# Patient Record
Sex: Female | Born: 2000 | Race: White | Hispanic: No | Marital: Single | Smoking: Former smoker
Health system: Southern US, Community
[De-identification: ages and names within clinical notes are randomized; demographics above are authoritative.]

## PROBLEM LIST (undated history)

## (undated) DIAGNOSIS — F419 Anxiety disorder, unspecified: Secondary | ICD-10-CM

## (undated) DIAGNOSIS — M549 Dorsalgia, unspecified: Secondary | ICD-10-CM

## (undated) DIAGNOSIS — F32A Depression, unspecified: Secondary | ICD-10-CM

## (undated) DIAGNOSIS — T7840XA Allergy, unspecified, initial encounter: Secondary | ICD-10-CM

## (undated) DIAGNOSIS — F329 Major depressive disorder, single episode, unspecified: Secondary | ICD-10-CM

## (undated) HISTORY — DX: Dorsalgia, unspecified: M54.9

## (undated) HISTORY — DX: Allergy, unspecified, initial encounter: T78.40XA

## (undated) HISTORY — DX: Major depressive disorder, single episode, unspecified: F32.9

## (undated) HISTORY — DX: Anxiety disorder, unspecified: F41.9

## (undated) HISTORY — DX: Depression, unspecified: F32.A

---
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
## 2019-01-04 ENCOUNTER — Encounter: Payer: Self-pay | Admitting: Physician Assistant

## 2019-01-05 ENCOUNTER — Ambulatory Visit (INDEPENDENT_AMBULATORY_CARE_PROVIDER_SITE_OTHER): Payer: Medicaid Other | Admitting: *Deleted

## 2019-01-05 ENCOUNTER — Other Ambulatory Visit: Payer: Self-pay

## 2019-01-05 DIAGNOSIS — Z3042 Encounter for surveillance of injectable contraceptive: Secondary | ICD-10-CM | POA: Diagnosis not present

## 2019-01-05 NOTE — Progress Notes (Signed)
Pt given Medroxyprogesterone inj Tolerated well 

## 2019-01-25 ENCOUNTER — Telehealth: Payer: Medicaid Other

## 2019-01-25 ENCOUNTER — Encounter: Payer: Self-pay | Admitting: Family Medicine

## 2019-01-25 ENCOUNTER — Ambulatory Visit (INDEPENDENT_AMBULATORY_CARE_PROVIDER_SITE_OTHER)
Admission: RE | Admit: 2019-01-25 | Discharge: 2019-01-25 | Disposition: A | Discharge status: Home / Self Care | Payer: Medicaid Other | Source: Ambulatory Visit

## 2019-01-25 DIAGNOSIS — B9789 Other viral agents as the cause of diseases classified elsewhere: Secondary | ICD-10-CM | POA: Diagnosis not present

## 2019-01-25 DIAGNOSIS — R05 Cough: Secondary | ICD-10-CM | POA: Diagnosis not present

## 2019-01-25 DIAGNOSIS — J069 Acute upper respiratory infection, unspecified: Secondary | ICD-10-CM | POA: Diagnosis not present

## 2019-01-25 DIAGNOSIS — J029 Acute pharyngitis, unspecified: Secondary | ICD-10-CM

## 2019-01-25 MED ORDER — ALBUTEROL SULFATE HFA 108 (90 BASE) MCG/ACT IN AERS: 1.0000 | INHALATION_SPRAY | Freq: Four times a day (QID) | RESPIRATORY_TRACT | 0 refills | Status: DC | PRN

## 2019-01-25 MED ORDER — CETIRIZINE HCL 10 MG PO TABS: 10.0000 mg | ORAL_TABLET | Freq: Every day | ORAL | 0 refills | Status: DC

## 2019-01-25 MED ORDER — BENZONATATE 100 MG PO CAPS: 100.0000 mg | ORAL_CAPSULE | Freq: Three times a day (TID) | ORAL | 0 refills | Status: DC

## 2019-01-25 NOTE — ED Provider Notes (Signed)
Virtual Visit via Video Note:  Ashlee Ramirez  initiated request for Telemedicine visit with [facility] team. I connected with Ashlee Ramirez  on 01/25/2019 at 1:38 PM  for a synchronized telemedicine visit using a video enabled HIPPA compliant telemedicine application. I verified that I am speaking with Ashlee Ramirez  using two identifiers. Georgetta Haber, NP  was physically located in a [facility] Urgent care site and ZAREAH KLEINSASSER was located at a different location.   The limitations of evaluation and management by telemedicine as well as the availability of in-person appointments were discussed. Patient was informed that she  may incur a bill ( including co-pay) for this virtual visit encounter. Ashlee Ramirez  expressed understanding and gave verbal consent to proceed with virtual visit.     History of Present Illness:Ashlee Ramirez  is a 18 y.o. female presents with complaints of 2-3 days of cough which is worsenign. Has a hard time stopping coughing. Denies shortness of breath. No fevers. No headache no body aches. Mild sore throat from cough. No ear pain. Patient's boyfriend's sister had a sinus infection- but her symptoms have been similar. No facial pressure. Taking OTC Cold and flu day and night formula. Has helped small amount. Denies gi/gu complaints. Smokes: 5 cigarettes a day. No asthma history. Has been off of school and work. School is to restart in two days and she is to go back to work in 1 week, which she is concerned about due to her symptoms.   Past Medical History:  Diagnosis Date  . Allergy   . Anxiety   . Back pain   . Depression     Allergies  Allergen Reactions  . Aspirin Other (See Comments)    nosebleed        Observations/Objective:  Alert, oriented. No increased work of breathing. No cough throughout exam. Speaking in complete sentences without difficulty. Skin appears dry. Lips normal tone.   Assessment and Plan: Viral URI  with cough. In presence of covid-19 pandemic do recommend 7 days of isolation. Discussed options for testing if wanted or required. Supportive cares. Return precautions provided. Patient verbalized understanding and agreeable to plan.    Follow Up Instructions:    I discussed the assessment and treatment plan with the patient. The patient was provided an opportunity to ask questions and all were answered. The patient agreed with the plan and demonstrated an understanding of the instructions.   The patient was advised to call back or seek an in-person evaluation if the symptoms worsen or if the condition fails to improve as anticipated.  I provided 15 minutes of non-face-to-face time during this encounter.    Georgetta Haber, NP  01/25/2019 1:38 PM         Georgetta Haber, NP 01/25/19 1339

## 2019-01-25 NOTE — Discharge Instructions (Addendum)
Nice to meet you! I have sent a few medications to help with your symptoms.  May continue with over the counter treatments as needed.  Use of inhaler as needed for wheezing or shortness of breath.   Tessalon 1-2 caps as needed for cough, every 8 hours.  Daily zyrtec.  Due to covid-19 pandemic we do recommend self isolation for 7 days from onset of cough symptoms, or until 3 days without fever, which ever is longer.  If you develop any worsening of symptoms, fevers, shortness of breath , chest pain  or persistent symptoms please come in to be seen or see your PCP.

## 2019-01-26 ENCOUNTER — Ambulatory Visit (INDEPENDENT_AMBULATORY_CARE_PROVIDER_SITE_OTHER): Payer: Medicaid Other | Admitting: Family Medicine

## 2019-01-26 ENCOUNTER — Other Ambulatory Visit: Payer: Self-pay

## 2019-01-26 NOTE — Progress Notes (Signed)
Error.  Patient did Televisit yesterday w/ Urgent care. No televisit needed.

## 2019-02-03 ENCOUNTER — Encounter: Payer: Self-pay | Admitting: Family

## 2019-02-03 ENCOUNTER — Telehealth (INDEPENDENT_AMBULATORY_CARE_PROVIDER_SITE_OTHER): Payer: Medicaid Other | Admitting: Family

## 2019-02-03 ENCOUNTER — Other Ambulatory Visit: Payer: Self-pay

## 2019-02-03 ENCOUNTER — Encounter: Payer: Self-pay | Admitting: Family Medicine

## 2019-02-03 DIAGNOSIS — J209 Acute bronchitis, unspecified: Secondary | ICD-10-CM | POA: Diagnosis not present

## 2019-02-03 MED ORDER — BENZONATATE 100 MG PO CAPS: 100.0000 mg | ORAL_CAPSULE | Freq: Three times a day (TID) | ORAL | 0 refills | Status: DC

## 2019-02-03 MED ORDER — FLUTICASONE PROPIONATE 50 MCG/ACT NA SUSP: 2.0000 | Freq: Every day | NASAL | 6 refills | Status: DC

## 2019-02-03 MED ORDER — PREDNISONE 10 MG (21) PO TBPK: ORAL_TABLET | ORAL | 0 refills | Status: DC

## 2019-02-03 NOTE — Progress Notes (Signed)
   Virtual Visit via telephone Note  I connected with Ashlee Ramirez on 02/03/19 at 3:23 pm by virtual visit  and verified that I am speaking with the correct person using two identifiers. Ashlee Ramirez is currently located at home  and no one is currently with her during visit. The provider, Jannifer Rodney, FNP is located in their office at time of visit.  I discussed the limitations, risks, security and privacy concerns of performing an evaluation and management service by telephone and the availability of in person appointments. I also discussed with the patient that there may be a patient responsible charge related to this service. The patient expressed understanding and agreed to proceed.   History and Present Illness:  PT connects to the office today for recurrent cough.  Cough  This is a new problem. The current episode started 1 to 4 weeks ago. The problem has been unchanged. The cough is non-productive. Pertinent negatives include no chills, ear congestion, ear pain, fever, myalgias, nasal congestion, rhinorrhea, sore throat, shortness of breath or wheezing. Associated symptoms comments: Sneezing . Risk factors for lung disease include smoking/tobacco exposure. She has tried rest and OTC cough suppressant (albuterol and tessalon) for the symptoms. The treatment provided mild relief.      Review of Systems  Constitutional: Negative for chills and fever.  HENT: Negative for ear pain, rhinorrhea and sore throat.   Respiratory: Positive for cough. Negative for shortness of breath and wheezing.   Musculoskeletal: Negative for myalgias.  All other systems reviewed and are negative.    Observations/Objective: No SOB or distress   Assessment and Plan: 1. Acute bronchitis, unspecified organism - Take meds as prescribed - Use a cool mist humidifier  -Use saline nose sprays frequently -Force fluids -For any cough or congestion  Use plain Mucinex- regular strength or max  strength is fine -For fever or aces or pains- take tylenol or ibuprofen. -Throat lozenges if help -RTO if symptoms worsen or do not improve  - predniSONE (STERAPRED UNI-PAK 21 TAB) 10 MG (21) TBPK tablet; Use as directed  Dispense: 21 tablet; Refill: 0 - benzonatate (TESSALON) 100 MG capsule; Take 1 capsule (100 mg total) by mouth every 8 (eight) hours.  Dispense: 21 capsule; Refill: 0 - fluticasone (FLONASE) 50 MCG/ACT nasal spray; Place 2 sprays into both nostrils daily.  Dispense: 16 g; Refill: 6     I discussed the assessment and treatment plan with the patient. The patient was provided an opportunity to ask questions and all were answered. The patient agreed with the plan and demonstrated an understanding of the instructions.   The patient was advised to call back or seek an in-person evaluation if the symptoms worsen or if the condition fails to improve as anticipated.  The above assessment and management plan was discussed with the patient. The patient verbalized understanding of and has agreed to the management plan. Patient is aware to call the clinic if symptoms persist or worsen. Patient is aware when to return to the clinic for a follow-up visit. Patient educated on when it is appropriate to go to the emergency department.   Time call ended: 3:38 pm   I provided 15 minutes of  face-to-face time during this encounter.    Jannifer Rodney, FNP

## 2019-02-04 ENCOUNTER — Other Ambulatory Visit: Payer: Self-pay

## 2019-02-04 ENCOUNTER — Encounter: Payer: Self-pay | Admitting: Family Medicine

## 2019-02-04 ENCOUNTER — Ambulatory Visit (INDEPENDENT_AMBULATORY_CARE_PROVIDER_SITE_OTHER): Payer: Medicaid Other | Admitting: Family Medicine

## 2019-02-04 DIAGNOSIS — B9689 Other specified bacterial agents as the cause of diseases classified elsewhere: Secondary | ICD-10-CM

## 2019-02-04 DIAGNOSIS — J208 Acute bronchitis due to other specified organisms: Secondary | ICD-10-CM

## 2019-02-04 MED ORDER — AMOXICILLIN-POT CLAVULANATE 875-125 MG PO TABS: 1.0000 | ORAL_TABLET | Freq: Two times a day (BID) | ORAL | 0 refills | Status: AC

## 2019-02-04 NOTE — Progress Notes (Signed)
Virtual Visit via telephone Note Due to COVID-19, visit is conducted virtually and was requested by patient. This visit type was conducted due to national recommendations for restrictions regarding the COVID-19 Pandemic (e.g. social distancing) in an effort to limit this patient's exposure and mitigate transmission in our community. All issues noted in this document were discussed and addressed.  A physical exam was not performed with this format.   I connected with Ashlee Ramirez on 02/04/19 at 1205 by telephone and verified that I am speaking with the correct person using two identifiers. Ashlee Ramirez is currently located at home and family is currently with them during visit. The provider, Kari Baars, FNP is located in their office at time of visit.  I discussed the limitations, risks, security and privacy concerns of performing an evaluation and management service by telephone and the availability of in person appointments. I also discussed with the patient that there may be a patient responsible charge related to this service. The patient expressed understanding and agreed to proceed.  Subjective:  Patient ID: Ashlee Ramirez, female    DOB: [birth year], 18 y.o.   MRN: [id number]  Chief Complaint:  Fever   HPI: Ashlee Ramirez is a 18 y.o. female presenting on 02/04/2019 for Fever   Pt reports ongoing cough, congestion, wheezing, and exertional shortness of breath. Pt states this started on Jan 24, 2019. She was seen in Urgent Care and diagnosed with URI. She was seen yesterday by video visit and diagnosed with acute bronchitis. Pt states she has been taking the prescribed tessalon, albuterol, and prednisone without relief of symptoms. She reports she developed a fever of 100.4 last night and continues to have a low grade fever. No chest pain, recent travel, or known COVID-19 exposures.     Relevant past medical, surgical, family, and social history reviewed and updated as  indicated.  Allergies and medications reviewed and updated.   Past Medical History:  Diagnosis Date  . Allergy   . Anxiety   . Back pain   . Depression     History reviewed. No pertinent surgical history.  Social History   Socioeconomic History  . Marital status: Single    Spouse name: Not on file  . Number of children: Not on file  . Years of education: Not on file  . Highest education level: Not on file  Occupational History  . Not on file  Social Needs  . Financial resource strain: Not on file  . Food insecurity:    Worry: Not on file    Inability: Not on file  . Transportation needs:    Medical: Not on file    Non-medical: Not on file  Tobacco Use  . Smoking status: Light Tobacco Smoker    Types: Cigarettes  . Smokeless tobacco: Never Used  Substance and Sexual Activity  . Alcohol use: No  . Drug use: No  . Sexual activity: Yes    Birth control/protection: Pill, Condom  Lifestyle  . Physical activity:    Days per week: Not on file    Minutes per session: Not on file  . Stress: Not on file  Relationships  . Social connections:    Talks on phone: Not on file    Gets together: Not on file    Attends religious service: Not on file    Active member of club or organization: Not on file    Attends meetings of clubs or organizations: Not on file  Relationship status: Not on file  . Intimate partner violence:    Fear of current or ex partner: Not on file    Emotionally abused: Not on file    Physically abused: Not on file    Forced sexual activity: Not on file  Other Topics Concern  . Not on file  Social History Narrative  . Not on file    Outpatient Encounter Medications as of 02/04/2019  Medication Sig  . albuterol (PROAIR HFA) 108 (90 Base) MCG/ACT inhaler Inhale 1-2 puffs into the lungs every 6 (six) hours as needed for wheezing or shortness of breath.  Marland Kitchen amoxicillin-clavulanate (AUGMENTIN) 875-125 MG tablet Take 1 tablet by mouth 2 (two) times daily  for 7 days.  . benzonatate (TESSALON) 100 MG capsule Take 1 capsule (100 mg total) by mouth every 8 (eight) hours.  . cetirizine (ZYRTEC) 10 MG tablet Take 1 tablet (10 mg total) by mouth daily.  . fluticasone (FLONASE) 50 MCG/ACT nasal spray Place 2 sprays into both nostrils daily.  . medroxyPROGESTERone (DEPO-PROVERA) 150 MG/ML injection Bring to office to have administered every 3 months  . predniSONE (STERAPRED UNI-PAK 21 TAB) 10 MG (21) TBPK tablet Use as directed   Facility-Administered Encounter Medications as of 02/04/2019  Medication  . medroxyPROGESTERone (DEPO-PROVERA) injection 150 mg    Allergies  Allergen Reactions  . Aspirin Other (See Comments)    nosebleed    Review of Systems  Constitutional: Positive for fatigue and fever. Negative for chills.  Respiratory: Positive for cough and shortness of breath (exertional). Negative for chest tightness and wheezing.   Cardiovascular: Negative for chest pain and palpitations.  Gastrointestinal: Negative for abdominal pain.  Genitourinary: Negative for decreased urine volume and difficulty urinating.  Musculoskeletal: Negative for arthralgias and myalgias.  Neurological: Negative for dizziness, syncope, weakness, light-headedness and headaches.  Psychiatric/Behavioral: Negative for confusion.  All other systems reviewed and are negative.        Observations/Objective: No vital signs or physical exam, this was a telephone or virtual health encounter.  Pt alert and oriented, answers all questions appropriately, and able to speak in full sentences.    Assessment and Plan: [name] was seen today for fever.  Diagnoses and all orders for this visit:  Acute bacterial bronchitis Due to ongoing symptoms and presence of fever, will initiate Augmentin. Continue other medications as prescribed. Report any new or worsening symptoms. Symptomatic care discussed.  -     amoxicillin-clavulanate (AUGMENTIN) 875-125 MG tablet; Take 1  tablet by mouth 2 (two) times daily for 7 days.     Follow Up Instructions: Return if symptoms worsen or fail to improve.    I discussed the assessment and treatment plan with the patient. The patient was provided an opportunity to ask questions and all were answered. The patient agreed with the plan and demonstrated an understanding of the instructions.   The patient was advised to call back or seek an in-person evaluation if the symptoms worsen or if the condition fails to improve as anticipated.  The above assessment and management plan was discussed with the patient. The patient verbalized understanding of and has agreed to the management plan. Patient is aware to call the clinic if symptoms persist or worsen. Patient is aware when to return to the clinic for a follow-up visit. Patient educated on when it is appropriate to go to the emergency department.    I provided 15 minutes of non-face-to-face time during this encounter. The call started at 1205. The  call ended at 1220. The other time was used for coordination of care.    Kari BaarsMichelle Frady Taddeo, FNP-C [facility] [address] [place], [place] [address] [phone]

## 2019-02-10 ENCOUNTER — Other Ambulatory Visit: Payer: Self-pay

## 2019-02-10 ENCOUNTER — Ambulatory Visit (HOSPITAL_COMMUNITY)
Admission: RE | Admit: 2019-02-10 | Discharge: 2019-02-10 | Disposition: A | Discharge status: Home / Self Care | Payer: BC Managed Care – PPO | Source: Ambulatory Visit | Attending: Emergency Medicine | Admitting: Emergency Medicine

## 2019-02-10 ENCOUNTER — Encounter (HOSPITAL_COMMUNITY): Payer: Self-pay

## 2019-02-10 ENCOUNTER — Emergency Department (HOSPITAL_COMMUNITY)
Admission: EM | Admit: 2019-02-10 | Discharge: 2019-02-10 | Disposition: A | Discharge status: Home / Self Care | Payer: BC Managed Care – PPO | Attending: Emergency Medicine | Admitting: Emergency Medicine

## 2019-02-10 DIAGNOSIS — R1011 Right upper quadrant pain: Secondary | ICD-10-CM | POA: Diagnosis not present

## 2019-02-10 DIAGNOSIS — R112 Nausea with vomiting, unspecified: Secondary | ICD-10-CM | POA: Insufficient documentation

## 2019-02-10 DIAGNOSIS — F17210 Nicotine dependence, cigarettes, uncomplicated: Secondary | ICD-10-CM | POA: Diagnosis not present

## 2019-02-10 DIAGNOSIS — Z79899 Other long term (current) drug therapy: Secondary | ICD-10-CM | POA: Insufficient documentation

## 2019-02-10 LAB — POC URINE PREG, ED: Preg Test, Ur: NEGATIVE

## 2019-02-10 LAB — CBC WITH DIFFERENTIAL/PLATELET
Abs Immature Granulocytes: 0.07 10*3/uL (ref 0.00–0.07)
Basophils Absolute: 0 10*3/uL (ref 0.0–0.1)
Basophils Relative: 0 %
Eosinophils Absolute: 0.4 10*3/uL (ref 0.0–0.5)
Eosinophils Relative: 3 %
HCT: 38.1 % (ref 36.0–46.0)
Hemoglobin: 12.9 g/dL (ref 12.0–15.0)
Immature Granulocytes: 1 %
Lymphocytes Relative: 28 %
Lymphs Abs: 3.3 10*3/uL (ref 0.7–4.0)
MCH: 30.4 pg (ref 26.0–34.0)
MCHC: 33.9 g/dL (ref 30.0–36.0)
MCV: 89.6 fL (ref 80.0–100.0)
Monocytes Absolute: 0.8 10*3/uL (ref 0.1–1.0)
Monocytes Relative: 7 %
Neutro Abs: 7.4 10*3/uL (ref 1.7–7.7)
Neutrophils Relative %: 61 %
Platelets: 302 10*3/uL (ref 150–400)
RBC: 4.25 MIL/uL (ref 3.87–5.11)
RDW: 12.4 % (ref 11.5–15.5)
WBC: 12 10*3/uL — ABNORMAL HIGH (ref 4.0–10.5)
nRBC: 0 % (ref 0.0–0.2)

## 2019-02-10 LAB — COMPREHENSIVE METABOLIC PANEL
ALT: 17 U/L (ref 0–44)
AST: 13 U/L — ABNORMAL LOW (ref 15–41)
Albumin: 4.2 g/dL (ref 3.5–5.0)
Alkaline Phosphatase: 68 U/L (ref 38–126)
Anion gap: 11 (ref 5–15)
BUN: 14 mg/dL (ref 6–20)
CO2: 24 mmol/L (ref 22–32)
Calcium: 8.9 mg/dL (ref 8.9–10.3)
Chloride: 102 mmol/L (ref 98–111)
Creatinine, Ser: 0.77 mg/dL (ref 0.44–1.00)
GFR calc Af Amer: >60 mL/min (ref >60)
GFR calc non Af Amer: >60 mL/min (ref >60)
Glucose, Bld: 94 mg/dL (ref 70–99)
Potassium: 3.4 mmol/L — ABNORMAL LOW (ref 3.5–5.1)
Sodium: 137 mmol/L (ref 135–145)
Total Bilirubin: 0.4 mg/dL (ref 0.3–1.2)
Total Protein: 7.5 g/dL (ref 6.5–8.1)

## 2019-02-10 LAB — URINALYSIS, ROUTINE W REFLEX MICROSCOPIC
Bacteria, UA: NONE SEEN
Bilirubin Urine: NEGATIVE
Glucose, UA: NEGATIVE mg/dL
Ketones, ur: NEGATIVE mg/dL
Leukocytes,Ua: NEGATIVE
Nitrite: NEGATIVE
Protein, ur: NEGATIVE mg/dL
Specific Gravity, Urine: 1.026 (ref 1.005–1.030)
pH: 5 (ref 5.0–8.0)

## 2019-02-10 LAB — LIPASE, BLOOD: Lipase: 36 U/L (ref 11–51)

## 2019-02-10 MED ORDER — ONDANSETRON 8 MG PO TBDP: 8.0000 mg | ORAL_TABLET | Freq: Three times a day (TID) | ORAL | 0 refills | Status: DC | PRN

## 2019-02-10 MED ORDER — HYDROCODONE-ACETAMINOPHEN 5-325 MG PO TABS: 1.0000 | ORAL_TABLET | Freq: Four times a day (QID) | ORAL | 0 refills | Status: DC | PRN

## 2019-02-10 MED ORDER — FENTANYL CITRATE (PF) 100 MCG/2ML IJ SOLN
50.0000 ug | Freq: Once | INTRAMUSCULAR | Status: AC
  Administered 2019-02-10: 50 ug via INTRAVENOUS
  Filled 2019-02-10: qty 2

## 2019-02-10 MED ORDER — ONDANSETRON HCL 4 MG/2ML IJ SOLN
4.0000 mg | Freq: Once | INTRAMUSCULAR | Status: AC
  Administered 2019-02-10: 4 mg via INTRAVENOUS
  Filled 2019-02-10: qty 2

## 2019-02-10 NOTE — ED Provider Notes (Signed)
[facility] EMERGENCY DEPARTMENT Provider Note   CSN: [id number] Arrival date & time: 02/10/19  0158    History   Chief Complaint Chief Complaint  Patient presents with  . Abdominal Pain    HPI Ashlee Ramirez is a 18 y.o. female.     The history is provided by the patient.  Abdominal Pain  Pain location:  RUQ Pain quality: sharp and stabbing   Pain radiates to:  Does not radiate Pain severity:  Moderate Onset quality:  Gradual Duration:  1 day Timing:  Intermittent Progression:  Worsening Chronicity:  New Context: not alcohol use   Relieved by:  Nothing Worsened by:  Palpation Ineffective treatments:  Acetaminophen Associated symptoms: vomiting   Associated symptoms: no chest pain, no diarrhea, no dysuria, no fever, no hematemesis, no hematochezia, no shortness of breath, no vaginal bleeding and no vaginal discharge   Risk factors: no alcohol abuse, no aspirin use, has not had multiple surgeries, no NSAID use and not pregnant   She reports onset of pain over 1 day ago  She reports associated nausea vomiting.  She has had nonbloody emesis.  She has never had this pain before Past Medical History:  Diagnosis Date  . Allergy   . Anxiety   . Back pain   . Depression     Patient Active Problem List   Diagnosis Date Noted  . BMI less than 19,adult 04/14/2018  . Depressive disorder 04/14/2018  . Excessive sleepiness 04/11/2018  . Anorexia 04/11/2018  . Enlarged tonsils 12/30/2017  . Seasonal allergies 12/30/2017  . Family history of early CAD 12/30/2017    History reviewed. No pertinent surgical history.   OB History    Gravida  0   Para  0   Term  0   Preterm  0   AB  0   Living  0     SAB  0   TAB  0   Ectopic  0   Multiple  0   Live Births  0            Home Medications    Prior to Admission medications   Medication Sig Start Date End Date Taking? Authorizing Provider  albuterol (PROAIR HFA) 108 (90 Base) MCG/ACT inhaler  Inhale 1-2 puffs into the lungs every 6 (six) hours as needed for wheezing or shortness of breath. 01/25/19   Georgetta HaberBurky, Natalie B, NP  amoxicillin-clavulanate (AUGMENTIN) 875-125 MG tablet Take 1 tablet by mouth 2 (two) times daily for 7 days. 02/04/19 02/11/19  Sonny Mastersakes, Linda M, FNP  benzonatate (TESSALON) 100 MG capsule Take 1 capsule (100 mg total) by mouth every 8 (eight) hours. 02/03/19   Junie SpencerHawks, Christy A, FNP  cetirizine (ZYRTEC) 10 MG tablet Take 1 tablet (10 mg total) by mouth daily. 01/25/19   Georgetta HaberBurky, Natalie B, NP  fluticasone (FLONASE) 50 MCG/ACT nasal spray Place 2 sprays into both nostrils daily. 02/03/19   Junie SpencerHawks, Christy A, FNP  medroxyPROGESTERone (DEPO-PROVERA) 150 MG/ML injection Bring to office to have administered every 3 months 09/08/18   Delynn FlavinGottschalk, Ashly M, DO  predniSONE (STERAPRED UNI-PAK 21 TAB) 10 MG (21) TBPK tablet Use as directed 02/03/19   Junie SpencerHawks, Christy A, FNP    Family History Family History  Problem Relation Age of Onset  . Pulmonary fibrosis Paternal Grandfather   . Heart attack Paternal Grandmother [id number]  . Hyperlipidemia Father   . Anxiety disorder Father   . Drug abuse Mother   . Atrial fibrillation Mother   .  Heart attack Maternal Grandfather 61    Social History Social History   Tobacco Use  . Smoking status: Light Tobacco Smoker    Types: Cigarettes  . Smokeless tobacco: Never Used  Substance Use Topics  . Alcohol use: No  . Drug use: No     Allergies   Aspirin   Review of Systems Review of Systems  Constitutional: Negative for fever.  Respiratory: Negative for shortness of breath.   Cardiovascular: Negative for chest pain.  Gastrointestinal: Positive for abdominal pain and vomiting. Negative for diarrhea, hematemesis and hematochezia.  Genitourinary: Negative for dysuria, vaginal bleeding and vaginal discharge.  All other systems reviewed and are negative.    Physical Exam Updated Vital Signs BP 120/70   Pulse 85   Temp 97.8 F (36.6 C) (Oral)    Resp 16   Ht 1.778 m (5\' 10" )   Wt 66.7 kg   SpO2 100%   BMI 21.09 kg/m   Physical Exam  CONSTITUTIONAL: Well developed/well nourished HEAD: Normocephalic/atraumatic EYES: EOMI/PERRL, no icterus ENMT: Mucous membranes moist NECK: supple no meningeal signs SPINE/BACK:entire spine nontender CV: S1/S2 noted, no murmurs/rubs/gallops noted LUNGS: Lungs are clear to auscultation bilaterally, no apparent distress ABDOMEN: soft, RUQ tenderness, no rebound or guarding, bowel sounds noted throughout abdomen GU: Right Cva tenderness NEURO: Pt is awake/alert/appropriate, moves all extremitiesx4.  No facial droop.   EXTREMITIES: pulses normal/equal, full ROM SKIN: warm, color normal PSYCH: no abnormalities of mood noted, alert and oriented to situation  ED Treatments / Results  Labs (all labs ordered are listed, but only abnormal results are displayed) Labs Reviewed  URINALYSIS, ROUTINE W REFLEX MICROSCOPIC - Abnormal; Notable for the following components:      Result Value   Hgb urine dipstick SMALL (*)    All other components within normal limits  CBC WITH DIFFERENTIAL/PLATELET - Abnormal; Notable for the following components:   WBC 12.0 (*)    All other components within normal limits  COMPREHENSIVE METABOLIC PANEL - Abnormal; Notable for the following components:   Potassium 3.4 (*)    AST 13 (*)    All other components within normal limits  LIPASE, BLOOD  POC URINE PREG, ED    EKG None  Radiology No results found.  Procedures Procedures  Medications Ordered in ED Medications  ondansetron (ZOFRAN) injection 4 mg (4 mg Intravenous Given 02/10/19 0228)  fentaNYL (SUBLIMAZE) injection 50 mcg (50 mcg Intravenous Given 02/10/19 0228)     Initial Impression / Assessment and Plan / ED Course  I have reviewed the triage vital signs and the nursing notes.  Pertinent labs  results that were available during my care of the patient were reviewed by me and considered in my  medical decision making (see chart for details).        Patient presents with right upper quadrant pain.  Overall she is well-appearing.  Labs essentially unremarkable except for mild elevation of white count She continues to have mild right upper quadrant tenderness.  Suspect biliary colic, but vitals appropriate, no vomiting, and LFTs reassuring. I feel she is appropriate for discharge home with follow-up as an outpatient.  Outpatient right upper quadrant ultrasound has been ordered.  Patient agreed with plan.  We discussed strict return precautions  Final Clinical Impressions(s) / ED Diagnoses   Final diagnoses:  Right upper quadrant abdominal pain    ED Discharge Orders    None       Ripley Fraise, MD 02/10/19 [phone]

## 2019-02-10 NOTE — ED Notes (Signed)
edp to room to assess  

## 2019-02-10 NOTE — ED Provider Notes (Signed)
Discussed negative [place] results. encouraged f/u with pcp. Return precautions given. Pt states she understands.    Franchot Heidelberg, PA-C 02/10/19 1337    Maudie Flakes, MD 02/11/19 [phone]

## 2019-02-10 NOTE — ED Triage Notes (Signed)
Monday night pt started having right sided pain. Not caused by anything.  A stabbing pain. Does hurt on palpation. 4/10 now. 6/10 on palpation. 5-6x of emesis. Described as brown.  Has never had any surgeries.

## 2019-02-11 ENCOUNTER — Other Ambulatory Visit: Payer: Self-pay

## 2019-02-11 DIAGNOSIS — Z20822 Contact with and (suspected) exposure to covid-19: Secondary | ICD-10-CM

## 2019-02-12 LAB — NOVEL CORONAVIRUS, NAA: SARS-CoV-2, NAA: NOT DETECTED

## 2019-02-15 ENCOUNTER — Telehealth: Payer: Self-pay | Admitting: Family Medicine

## 2019-02-15 NOTE — Telephone Encounter (Signed)
Results faxed to [facility]

## 2019-02-15 NOTE — Telephone Encounter (Signed)
Pt had COVID 19 test knows results are negative pt employer is requesting note showing results so she can return to work  [place]

## 2019-02-22 ENCOUNTER — Telehealth (INDEPENDENT_AMBULATORY_CARE_PROVIDER_SITE_OTHER): Payer: Medicaid Other | Admitting: Family Medicine

## 2019-02-22 ENCOUNTER — Encounter: Payer: Self-pay | Admitting: Family Medicine

## 2019-02-22 ENCOUNTER — Other Ambulatory Visit: Payer: Self-pay

## 2019-02-22 DIAGNOSIS — R1011 Right upper quadrant pain: Secondary | ICD-10-CM

## 2019-02-22 NOTE — Progress Notes (Signed)
Virtual Visit via video Note Due to COVID-19, visit is conducted virtually and was requested by patient. This visit type was conducted due to national recommendations for restrictions regarding the COVID-19 Pandemic (e.g. social distancing) in an effort to limit this patient's exposure and mitigate transmission in our community. All issues noted in this document were discussed and addressed.  A physical exam was not performed with this format.   I connected with Ashlee MorrowKayla L Nowakowski on 02/22/19 at 0830 by MyChart video and verified that I am speaking with the correct person using two identifiers. Ashlee Ramirez is currently located at home and no one is currently with them during visit. The provider, Kari BaarsMichelle Nohea Kras, FNP is located in their office at time of visit.  I discussed the limitations, risks, security and privacy concerns of performing an evaluation and management service by video and the availability of in person appointments. I also discussed with the patient that there may be a patient responsible charge related to this service. The patient expressed understanding and agreed to proceed.  Subjective:  Patient ID: Ashlee Ramirez, female    DOB: 04-Apr-2001, 18 y.o.   MRN: [id number]  Chief Complaint:  Abdominal Pain   HPI: Ashlee Ramirez is a 18 y.o. female presenting on 02/22/2019 for Abdominal Pain   Pt reports ongoing RUQ abdominal pain that radiates to her shoulder. States the pain is worse after eating a fatty or greasy meal. States she has nausea with the pain. She states she was seen in the ED on 02/10/2019 for same and was told everything was normal. Her WBC was slightly elevated, 12, at that time. No amylase or lipase reported. Liver enzymes were normal. A RUQ US was completed and was normal. She states the pain is intermittent and severe when is happens, 8/10, sharp and shooting. States she does have nausea with the pain. No rectal bleeding, hematochezia, melena, vomiting,  fever, NSAID use, alcohol use, pregnancy, or weakness.   Abdominal Pain This is a recurrent problem. The current episode started 1 to 4 weeks ago. The onset quality is sudden. The problem occurs intermittently. The problem has been waxing and waning. The pain is located in the RUQ. The pain is at a severity of 8/10. The pain is severe. The quality of the pain is sharp and cramping. The abdominal pain radiates to the right shoulder and chest. Associated symptoms include nausea. Pertinent negatives include no anorexia, arthralgias, belching, constipation, diarrhea, dysuria, fever, flatus, frequency, headaches, hematochezia, hematuria, melena, myalgias, vomiting or weight loss. The pain is aggravated by eating and palpation. The pain is relieved by nothing. She has tried acetaminophen for the symptoms. The treatment provided no relief. Prior diagnostic workup includes ultrasound. There is no history of abdominal surgery, colon cancer, Crohn's disease, gallstones, GERD, irritable bowel syndrome, pancreatitis, PUD or ulcerative colitis.     Relevant past medical, surgical, family, and social history reviewed and updated as indicated.  Allergies and medications reviewed and updated.   Past Medical History:  Diagnosis Date  . Allergy   . Anxiety   . Back pain   . Depression     History reviewed. No pertinent surgical history.  Social History   Socioeconomic History  . Marital status: Single    Spouse name: Not on file  . Number of children: Not on file  . Years of education: Not on file  . Highest education level: Not on file  Occupational History  . Not on file  Social  Needs  . Financial resource strain: Not on file  . Food insecurity    Worry: Not on file    Inability: Not on file  . Transportation needs    Medical: Not on file    Non-medical: Not on file  Tobacco Use  . Smoking status: Light Tobacco Smoker    Types: Cigarettes  . Smokeless tobacco: Never Used  Substance and  Sexual Activity  . Alcohol use: No  . Drug use: No  . Sexual activity: Yes    Birth control/protection: Pill, Condom  Lifestyle  . Physical activity    Days per week: Not on file    Minutes per session: Not on file  . Stress: Not on file  Relationships  . Social Musicianconnections    Talks on phone: Not on file    Gets together: Not on file    Attends religious service: Not on file    Active member of club or organization: Not on file    Attends meetings of clubs or organizations: Not on file    Relationship status: Not on file  . Intimate partner violence    Fear of current or ex partner: Not on file    Emotionally abused: Not on file    Physically abused: Not on file    Forced sexual activity: Not on file  Other Topics Concern  . Not on file  Social History Narrative  . Not on file    Outpatient Encounter Medications as of 02/22/2019  Medication Sig  . albuterol (PROAIR HFA) 108 (90 Base) MCG/ACT inhaler Inhale 1-2 puffs into the lungs every 6 (six) hours as needed for wheezing or shortness of breath.  . benzonatate (TESSALON) 100 MG capsule Take 1 capsule (100 mg total) by mouth every 8 (eight) hours.  . cetirizine (ZYRTEC) 10 MG tablet Take 1 tablet (10 mg total) by mouth daily.  . fluticasone (FLONASE) 50 MCG/ACT nasal spray Place 2 sprays into both nostrils daily.  Marland Kitchen. HYDROcodone-acetaminophen (NORCO/VICODIN) 5-325 MG tablet Take 1 tablet by mouth every 6 (six) hours as needed.  . medroxyPROGESTERone (DEPO-PROVERA) 150 MG/ML injection Bring to office to have administered every 3 months  . ondansetron (ZOFRAN ODT) 8 MG disintegrating tablet Take 1 tablet (8 mg total) by mouth every 8 (eight) hours as needed.  . predniSONE (STERAPRED UNI-PAK 21 TAB) 10 MG (21) TBPK tablet Use as directed   Facility-Administered Encounter Medications as of 02/22/2019  Medication  . medroxyPROGESTERone (DEPO-PROVERA) injection 150 mg    Allergies  Allergen Reactions  . Aspirin Other (See  Comments)    nosebleed    Review of Systems  Constitutional: Negative for chills, fatigue, fever and weight loss.  Respiratory: Negative for cough, chest tightness and shortness of breath.   Cardiovascular: Negative for chest pain, palpitations and leg swelling.  Gastrointestinal: Positive for abdominal pain and nausea. Negative for abdominal distention, anal bleeding, anorexia, blood in stool, constipation, diarrhea, flatus, hematochezia, melena, rectal pain and vomiting.  Genitourinary: Negative for decreased urine volume, difficulty urinating, dyspareunia, dysuria, frequency, hematuria, vaginal bleeding, vaginal discharge and vaginal pain.  Musculoskeletal: Negative for arthralgias and myalgias.  Neurological: Negative for dizziness, weakness and headaches.  Psychiatric/Behavioral: Negative for confusion.  All other systems reviewed and are negative.        Observations/Objective: No vital signs or physical exam, this was a telephone or virtual health encounter.  Pt alert and oriented, answers all questions appropriately, and able to speak in full sentences.    Assessment  and Plan: Van was seen today for abdominal pain.  Diagnoses and all orders for this visit:  RUQ pain Reported symptoms concerning for GB sludge or cholecystitis. [place] was negative. Pt still having symptoms after a fatty or greasy meal. Will order HIDA scan. Pt to follow up after imaging or sooner if symptoms worsen. Diet discussed.  -     NM Hepato W/Eject Fract; Future     Follow Up Instructions: Return in about 2 weeks (around 03/08/2019), or if symptoms worsen or fail to improve, for RUQ pain.    I discussed the assessment and treatment plan with the patient. The patient was provided an opportunity to ask questions and all were answered. The patient agreed with the plan and demonstrated an understanding of the instructions.   The patient was advised to call back or seek an in-person evaluation if the  symptoms worsen or if the condition fails to improve as anticipated.  The above assessment and management plan was discussed with the patient. The patient verbalized understanding of and has agreed to the management plan. Patient is aware to call the clinic if symptoms persist or worsen. Patient is aware when to return to the clinic for a follow-up visit. Patient educated on when it is appropriate to go to the emergency department.    I provided 15 minutes of video face-to-face time during this encounter. The call started at 0830. The call ended at [place]. The other time was used for coordination of care.    Monia Pouch, FNP-C [facility] [address] [place], [place] [address] [phone]

## 2019-03-09 ENCOUNTER — Encounter (HOSPITAL_COMMUNITY): Admission: RE | Admit: 2019-03-09 | Payer: BC Managed Care – PPO | Source: Ambulatory Visit

## 2019-03-25 ENCOUNTER — Encounter: Payer: Self-pay | Admitting: Family Medicine

## 2019-07-01 ENCOUNTER — Other Ambulatory Visit: Payer: Self-pay

## 2019-07-02 ENCOUNTER — Ambulatory Visit (INDEPENDENT_AMBULATORY_CARE_PROVIDER_SITE_OTHER): Payer: Medicaid Other | Admitting: Family Medicine

## 2019-07-02 ENCOUNTER — Encounter: Payer: Self-pay | Admitting: Family Medicine

## 2019-07-02 VITALS — BP 127/81 | HR 81 | Temp 97.8°F | Ht 70.0 in | Wt 152.0 lb

## 2019-07-02 DIAGNOSIS — R6889 Other general symptoms and signs: Secondary | ICD-10-CM

## 2019-07-02 DIAGNOSIS — L299 Pruritus, unspecified: Secondary | ICD-10-CM | POA: Diagnosis not present

## 2019-07-02 DIAGNOSIS — Z30011 Encounter for initial prescription of contraceptive pills: Secondary | ICD-10-CM | POA: Diagnosis not present

## 2019-07-02 DIAGNOSIS — F5101 Primary insomnia: Secondary | ICD-10-CM | POA: Diagnosis not present

## 2019-07-02 LAB — PREGNANCY, URINE: Preg Test, Ur: NEGATIVE

## 2019-07-02 MED ORDER — NORGESTIM-ETH ESTRAD TRIPHASIC 0.18/0.215/0.25 MG-35 MCG PO TABS: 1.0000 | ORAL_TABLET | Freq: Every day | ORAL | 2 refills | Status: DC

## 2019-07-02 MED ORDER — TRAZODONE HCL 50 MG PO TABS: 25.0000 mg | ORAL_TABLET | Freq: Every evening | ORAL | 2 refills | Status: DC | PRN

## 2019-07-02 NOTE — Patient Instructions (Signed)
You had labs performed today.  You will be contacted with the results of the labs once they are available, usually in the next 3 business days for routine lab work.  If you have an active my chart account, they will be released to your MyChart.  If you prefer to have these labs released to you via telephone, please let us know.  If you had a pap smear or biopsy performed, expect to be contacted in about 7-10 days.  Insomnia Insomnia is a sleep disorder that makes it difficult to fall asleep or stay asleep. Insomnia can cause fatigue, low energy, difficulty concentrating, mood swings, and poor performance at work or school. There are three different ways to classify insomnia:  Difficulty falling asleep.  Difficulty staying asleep.  Waking up too early in the morning. Any type of insomnia can be long-term (chronic) or short-term (acute). Both are common. Short-term insomnia usually lasts for three months or less. Chronic insomnia occurs at least three times a week for longer than three months. What are the causes? Insomnia may be caused by another condition, situation, or substance, such as:  Anxiety.  Certain medicines.  Gastroesophageal reflux disease (GERD) or other gastrointestinal conditions.  Asthma or other breathing conditions.  Restless legs syndrome, sleep apnea, or other sleep disorders.  Chronic pain.  Menopause.  Stroke.  Abuse of alcohol, tobacco, or illegal drugs.  Mental health conditions, such as depression.  Caffeine.  Neurological disorders, such as Alzheimer's disease.  An overactive thyroid (hyperthyroidism). Sometimes, the cause of insomnia may not be known. What increases the risk? Risk factors for insomnia include:  Gender. Women are affected more often than men.  Age. Insomnia is more common as you get older.  Stress.  Lack of exercise.  Irregular work schedule or working night shifts.  Traveling between different time zones.  Certain  medical and mental health conditions. What are the signs or symptoms? If you have insomnia, the main symptom is having trouble falling asleep or having trouble staying asleep. This may lead to other symptoms, such as:  Feeling fatigued or having low energy.  Feeling nervous about going to sleep.  Not feeling rested in the morning.  Having trouble concentrating.  Feeling irritable, anxious, or depressed. How is this diagnosed? This condition may be diagnosed based on:  Your symptoms and medical history. Your health care provider may ask about: ? Your sleep habits. ? Any medical conditions you have. ? Your mental health.  A physical exam. How is this treated? Treatment for insomnia depends on the cause. Treatment may focus on treating an underlying condition that is causing insomnia. Treatment may also include:  Medicines to help you sleep.  Counseling or therapy.  Lifestyle adjustments to help you sleep better. Follow these instructions at home: Eating and drinking   Limit or avoid alcohol, caffeinated beverages, and cigarettes, especially close to bedtime. These can disrupt your sleep.  Do not eat a large meal or eat spicy foods right before bedtime. This can lead to digestive discomfort that can make it hard for you to sleep. Sleep habits   Keep a sleep diary to help you and your health care provider figure out what could be causing your insomnia. Write down: ? When you sleep. ? When you wake up during the night. ? How well you sleep. ? How rested you feel the next day. ? Any side effects of medicines you are taking. ? What you eat and drink.  Make your bedroom a  dark, comfortable place where it is easy to fall asleep. ? Put up shades or blackout curtains to block light from outside. ? Use a white noise machine to block noise. ? Keep the temperature cool.  Limit screen use before bedtime. This includes: ? Watching TV. ? Using your smartphone, tablet, or  computer.  Stick to a routine that includes going to bed and waking up at the same times every day and night. This can help you fall asleep faster. Consider making a quiet activity, such as reading, part of your nighttime routine.  Try to avoid taking naps during the day so that you sleep better at night.  Get out of bed if you are still awake after 15 minutes of trying to sleep. Keep the lights down, but try reading or doing a quiet activity. When you feel sleepy, go back to bed. General instructions  Take over-the-counter and prescription medicines only as told by your health care provider.  Exercise regularly, as told by your health care provider. Avoid exercise starting several hours before bedtime.  Use relaxation techniques to manage stress. Ask your health care provider to suggest some techniques that may work well for you. These may include: ? Breathing exercises. ? Routines to release muscle tension. ? Visualizing peaceful scenes.  Make sure that you drive carefully. Avoid driving if you feel very sleepy.  Keep all follow-up visits as told by your health care provider. This is important. Contact a health care provider if:  You are tired throughout the day.  You have trouble in your daily routine due to sleepiness.  You continue to have sleep problems, or your sleep problems get worse. Get help right away if:  You have serious thoughts about hurting yourself or someone else. If you ever feel like you may hurt yourself or others, or have thoughts about taking your own life, get help right away. You can go to your nearest emergency department or call:  Your local emergency services (911 in the [place]  A suicide crisis helpline, such as the [facility] at 618-841-7030. This is open 24 hours a day. Summary  Insomnia is a sleep disorder that makes it difficult to fall asleep or stay asleep.  Insomnia can be long-term (chronic) or short-term  (acute).  Treatment for insomnia depends on the cause. Treatment may focus on treating an underlying condition that is causing insomnia.  Keep a sleep diary to help you and your health care provider figure out what could be causing your insomnia. This information is not intended to replace advice given to you by your health care provider. Make sure you discuss any questions you have with your health care provider. Document Released: 08/16/2000 Document Revised: 08/01/2017 Document Reviewed: 05/29/2017 Elsevier Patient Education  2020 [employer].

## 2019-07-02 NOTE — Progress Notes (Signed)
Subjective: CC: Contraception PCP: Janora Norlander, DO XTK:WIOXB Ashlee Ramirez is a 18 y.o. female presenting to clinic today for:  1. Contraceptive counseling Patient presents today for discussion of contraception.  She is a [id number] .  She reports her menstrual cycles are not regular since discontinuing the Depo-Provera.  Previous methods of birth control tried: OCPs, Depo.  She is sexually active with 1 female partner.  She denies h/o STIs.  She denies heavy menstrual bleeding, excessive cramping, abdominal masses.  She denies personal or family history of: liver disease, breast cancer, clotting disorder (including DVT/PE), migraine headaches.  No LMP recorded.  Last pap: N/A Last unprotected sex: Yesterday     2.  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  She feels cold even in 80 degree weather and often has to wear sweatpants.  Denies any unplanned weight loss.  3.  Pruritus Patient reports bilateral lower extremity pruritus without rash.  She notes that the symptoms are refractory to topical hydrocortisone cream, oral Benadryl.  Denies any change in soaps, lotions, detergents.  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  4.  Insomnia Patient reports difficulty with sleep.  She is able to fall asleep without difficulty but often does not stay asleep.  She wakes up pretty consistently and finds it difficult to fall back asleep.  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  She does report some increased stress with her job surrounding [place].  ROS: Per HPI  Allergies  Allergen Reactions  . Aspirin Other (See Comments)    nosebleed   Past Medical History:  Diagnosis Date  . Allergy   . Anxiety   . Back pain   . Depression    No current outpatient medications on file. Social  History   Socioeconomic History  . Marital status: Single    Spouse name: Not on file  . Number of children: Not on file  . Years of education: Not on file  . Highest education level: Not on file  Occupational History  . Not on file  Social Needs  . Financial resource strain: Not on file  . Food insecurity    Worry: Not on file    Inability: Not on file  . Transportation needs    Medical: Not on file    Non-medical: Not on file  Tobacco Use  . Smoking status: Current Every Day Smoker    Packs/day: 0.50    Types: Cigarettes  . Smokeless tobacco: Never Used  Substance and Sexual Activity  . Alcohol use: No  . Drug use: No  . Sexual activity: Yes    Birth control/protection: Pill, Condom  Lifestyle  . Physical activity    Days per week: Not on file    Minutes per session: Not on file  . Stress: Not on file  Relationships  . Social Herbalist on phone: Not on file    Gets together: Not on file    Attends religious service: Not on file    Active member of club or organization: Not on file    Attends meetings of clubs or organizations: Not on file    Relationship status: Not on file  . Intimate partner violence    Fear of current or  ex partner: Not on file    Emotionally abused: Not on file    Physically abused: Not on file    Forced sexual activity: Not on file  Other Topics Concern  . Not on file  Social History Narrative  . Not on file   Family History  Problem Relation Age of Onset  . Pulmonary fibrosis Paternal Grandfather   . Heart attack Paternal Grandmother 20  . Hyperlipidemia Father   . Anxiety disorder Father   . Drug abuse Mother   . Atrial fibrillation Mother   . Heart attack Maternal Grandfather 61    Objective: Office vital signs reviewed. BP 127/81   Pulse 81   Temp 97.8 F (36.6 C) (Temporal)   Ht 5' 10"  (1.778 m)   Wt 152 lb (68.9 kg)   BMI 21.81 kg/m   Physical Examination:  General: Awake, alert, well nourished, No  acute distress HEENT: Normal; no goiter, no exophthalmos Cardio: regular rate and rhythm, S1S2 heard, no murmurs appreciated Pulm: clear to auscultation bilaterally, no wheezes, rhonchi or rales; normal work of breathing on room air Extremities: warm, well perfused, No edema, cyanosis or clubbing; +2 pulses bilaterally MSK: normal gait and station Skin: dry; intact; no rashes or lesions appreciated on LEs Neuro: No tremor  Assessment/ Plan: 18 y.o. female   1. Encounter for initial prescription of contraceptive pills Wishes to resume OCPs.  Ortho Tri-Cyclen prescribed.  May start immediately.  Her urine pregnancy was negative.  I suspect that the absence of menses is related to dysregulation of ovulation after Depo-Provera. - Pregnancy, urine - Norgestimate-Ethinyl Estradiol Triphasic (ORTHO TRI-CYCLEN, 28,) 0.18/0.215/0.25 MG-35 MCG tablet; Take 1 tablet by mouth daily.  Dispense: 1 Package; Refill: 2  2. Pruritus Uncertain etiology.  We will look for eosinophilic changes and bilirubin levels to rule out other etiologies of pruritus without rash - CMP14+EGFR - CBC with Differential  3. Cold intolerance Check thyroid.  Check for anemia - TSH - CMP14+EGFR - CBC with Differential  4. Primary insomnia Trial of trazodone.  Home care directions were reviewed with the patient.  She will follow-up in 3 months for recheck. - traZODone (DESYREL) 50 MG tablet; Take 0.5-1 tablets (25-50 mg total) by mouth at bedtime as needed for sleep.  Dispense: 30 tablet; Refill: 2   Orders Placed This Encounter  Procedures  . Pregnancy, urine  . TSH  . CMP14+EGFR  . CBC with Differential   Meds ordered this encounter  Medications  . traZODone (DESYREL) 50 MG tablet    Sig: Take 0.5-1 tablets (25-50 mg total) by mouth at bedtime as needed for sleep.    Dispense:  30 tablet    Refill:  2  . Norgestimate-Ethinyl Estradiol Triphasic (ORTHO TRI-CYCLEN, 28,) 0.18/0.215/0.25 MG-35 MCG tablet    Sig:  Take 1 tablet by mouth daily.    Dispense:  1 Package    Refill:  [place] [place] [phone]

## 2019-07-03 LAB — CMP14+EGFR
ALT: 14 IU/L (ref 0–32)
AST: 16 IU/L (ref 0–40)
Albumin/Globulin Ratio: 1.7 (ref 1.2–2.2)
Albumin: 4.8 g/dL (ref 3.9–5.0)
Alkaline Phosphatase: 105 IU/L (ref 39–117)
BUN/Creatinine Ratio: 9 (ref 9–23)
BUN: 8 mg/dL (ref 6–20)
Bilirubin Total: 0.5 mg/dL (ref 0.0–1.2)
CO2: 24 mmol/L (ref 20–29)
Calcium: 10.2 mg/dL (ref 8.7–10.2)
Chloride: 105 mmol/L (ref 96–106)
Creatinine, Ser: 0.85 mg/dL (ref 0.57–1.00)
GFR calc Af Amer: 115 mL/min/{1.73_m2} (ref >59)
GFR calc non Af Amer: 100 mL/min/{1.73_m2} (ref >59)
Globulin, Total: 2.8 g/dL (ref 1.5–4.5)
Glucose: 88 mg/dL (ref 65–99)
Potassium: 4.9 mmol/L (ref 3.5–5.2)
Sodium: 140 mmol/L (ref 134–144)
Total Protein: 7.6 g/dL (ref 6.0–8.5)

## 2019-07-03 LAB — CBC WITH DIFFERENTIAL/PLATELET
Basophils Absolute: 0 10*3/uL (ref 0.0–0.2)
Basos: 0 %
EOS (ABSOLUTE): 0.2 10*3/uL (ref 0.0–0.4)
Eos: 3 %
Hematocrit: 40.6 % (ref 34.0–46.6)
Hemoglobin: 13.7 g/dL (ref 11.1–15.9)
Immature Grans (Abs): 0 10*3/uL (ref 0.0–0.1)
Immature Granulocytes: 0 %
Lymphocytes Absolute: 1.5 10*3/uL (ref 0.7–3.1)
Lymphs: 31 %
MCH: 30 pg (ref 26.6–33.0)
MCHC: 33.7 g/dL (ref 31.5–35.7)
MCV: 89 fL (ref 79–97)
Monocytes Absolute: 0.5 10*3/uL (ref 0.1–0.9)
Monocytes: 9 %
Neutrophils Absolute: 2.8 10*3/uL (ref 1.4–7.0)
Neutrophils: 57 %
Platelets: 313 10*3/uL (ref 150–450)
RBC: 4.57 x10E6/uL (ref 3.77–5.28)
RDW: 12.5 % (ref 11.7–15.4)
WBC: 4.9 10*3/uL (ref 3.4–10.8)

## 2019-07-03 LAB — TSH: TSH: 1.22 u[IU]/mL (ref 0.450–4.500)

## 2019-08-04 ENCOUNTER — Encounter: Payer: Self-pay | Admitting: Family Medicine

## 2019-09-01 ENCOUNTER — Encounter: Payer: Self-pay | Admitting: Family Medicine

## 2019-09-20 ENCOUNTER — Other Ambulatory Visit: Payer: Self-pay | Admitting: Family Medicine

## 2019-09-20 DIAGNOSIS — Z30011 Encounter for initial prescription of contraceptive pills: Secondary | ICD-10-CM

## 2019-10-05 ENCOUNTER — Ambulatory Visit: Payer: Medicaid Other | Admitting: Family Medicine

## 2019-10-06 ENCOUNTER — Encounter: Payer: Self-pay | Admitting: Family Medicine

## 2019-10-08 ENCOUNTER — Ambulatory Visit: Payer: Medicaid Other | Admitting: Family Medicine

## 2019-10-21 ENCOUNTER — Telehealth: Payer: Self-pay | Admitting: *Deleted

## 2019-10-21 NOTE — Telephone Encounter (Signed)
Pt was a noshow for an appt, called and scheduled her f/u 11/15/19 with Dr Reece Agar at 1:15.

## 2019-11-15 ENCOUNTER — Ambulatory Visit: Payer: Medicaid Other | Admitting: Family Medicine

## 2020-02-29 ENCOUNTER — Encounter: Payer: Medicaid Other | Admitting: Family Medicine

## 2020-02-29 ENCOUNTER — Encounter: Payer: Self-pay | Admitting: Family Medicine

## 2020-07-12 ENCOUNTER — Encounter: Payer: Managed Care, Other (non HMO) | Admitting: Family Medicine

## 2020-07-12 ENCOUNTER — Other Ambulatory Visit: Payer: Self-pay

## 2020-08-22 ENCOUNTER — Encounter: Payer: Self-pay | Admitting: *Deleted

## 2020-08-23 ENCOUNTER — Ambulatory Visit: Payer: Self-pay | Admitting: Adult Health

## 2020-08-30 ENCOUNTER — Ambulatory Visit (INDEPENDENT_AMBULATORY_CARE_PROVIDER_SITE_OTHER): Payer: Managed Care, Other (non HMO) | Admitting: Adult Health

## 2020-08-30 ENCOUNTER — Other Ambulatory Visit: Payer: Self-pay

## 2020-08-30 DIAGNOSIS — Z5329 Procedure and treatment not carried out because of patient's decision for other reasons: Secondary | ICD-10-CM

## 2020-08-30 NOTE — Progress Notes (Signed)
    New patient visit   Patient: Ashlee Ramirez   DOB: Mar 03, 2001   19 y.o. Female  MRN: [id number] Visit Date: 08/30/2020  Today's healthcare provider: Jairo Ben, FNP   Chief Complaint  Patient presents with  . New Patient (Initial Visit)   Subjective    SUI KASPAREK is a 19 y.o. female who presents today as a new patient to establish care she was not seen but sent to the emergencyt room for chest pain, tightness and variable heartrate reported due to office protocol. She was in no acute distress.  HPI  Patient reports that she feels fairly well today but would like to address fluctuatuing heart rate that has been ranging from 40-200 for the past 7 days, patient reports palpitations and chest tightness, chest  pain. Due to office protocol and patient with chest pain. Tightness for 7 days she is advised ED or urgent care for covid testing. Patient verbalized understanding of all instructions given and denies any further questions at this time.   No patient contact by provider.  Offered EMS for transport, she declined.  She will call to reschedule new patient appointment and seek emergency medical care now for chest pain and chest tightness with heart rate reported between 40-200 for last 7 days. She was advised to go via ambulance to the emergency room. Declined and left office.   [phone] (phone) [phone] (fax)  [facility]

## 2020-08-30 NOTE — Patient Instructions (Addendum)
Emergency room now or urgent care for evaluation advised now due to office policy. 911 was declined.

## 2020-08-31 DIAGNOSIS — Z5329 Procedure and treatment not carried out because of patient's decision for other reasons: Secondary | ICD-10-CM | POA: Insufficient documentation

## 2020-09-11 ENCOUNTER — Telehealth: Payer: Managed Care, Other (non HMO) | Admitting: Family

## 2020-09-11 DIAGNOSIS — R059 Cough, unspecified: Secondary | ICD-10-CM | POA: Diagnosis not present

## 2020-09-11 MED ORDER — ALBUTEROL SULFATE HFA 108 (90 BASE) MCG/ACT IN AERS: 2.0000 | INHALATION_SPRAY | Freq: Four times a day (QID) | RESPIRATORY_TRACT | 0 refills | Status: DC | PRN

## 2020-09-11 MED ORDER — BENZONATATE 100 MG PO CAPS: 100.0000 mg | ORAL_CAPSULE | Freq: Three times a day (TID) | ORAL | 0 refills | Status: DC | PRN

## 2020-09-11 MED ORDER — PREDNISONE 10 MG (21) PO TBPK: ORAL_TABLET | ORAL | 0 refills | Status: DC

## 2020-09-11 NOTE — Addendum Note (Signed)
Addended by: Jannifer Rodney A on: 09/11/2020 10:56 AM   Modules accepted: Orders

## 2020-09-11 NOTE — Progress Notes (Signed)

## 2020-09-19 ENCOUNTER — Other Ambulatory Visit: Payer: Self-pay

## 2020-09-19 ENCOUNTER — Encounter: Payer: Self-pay | Admitting: Internal Medicine

## 2020-09-19 ENCOUNTER — Ambulatory Visit (INDEPENDENT_AMBULATORY_CARE_PROVIDER_SITE_OTHER): Payer: Managed Care, Other (non HMO) | Admitting: Internal Medicine

## 2020-09-19 VITALS — BP 120/82 | HR 87 | Temp 98.1°F | Resp 16 | Ht 71.0 in | Wt 148.2 lb

## 2020-09-19 DIAGNOSIS — Z0289 Encounter for other administrative examinations: Secondary | ICD-10-CM

## 2020-09-19 DIAGNOSIS — Z111 Encounter for screening for respiratory tuberculosis: Secondary | ICD-10-CM

## 2020-09-19 DIAGNOSIS — R000 Tachycardia, unspecified: Secondary | ICD-10-CM

## 2020-09-19 DIAGNOSIS — Z7689 Persons encountering health services in other specified circumstances: Secondary | ICD-10-CM | POA: Diagnosis not present

## 2020-09-19 DIAGNOSIS — Z30013 Encounter for initial prescription of injectable contraceptive: Secondary | ICD-10-CM

## 2020-09-19 LAB — POCT URINALYSIS DIP (CLINITEK)
Bilirubin, UA: NEGATIVE
Blood, UA: NEGATIVE
Glucose, UA: NEGATIVE mg/dL
Ketones, POC UA: NEGATIVE mg/dL
Leukocytes, UA: NEGATIVE
Nitrite, UA: NEGATIVE
POC PROTEIN,UA: NEGATIVE
Spec Grav, UA: 1.015 (ref 1.010–1.025)
Urobilinogen, UA: 1 E.U./dL
pH, UA: 7.5 (ref 5.0–8.0)

## 2020-09-19 LAB — POCT URINE PREGNANCY: Preg Test, Ur: NEGATIVE

## 2020-09-19 MED ORDER — MEDROXYPROGESTERONE ACETATE 150 MG/ML IM SUSP
150.0000 mg | Freq: Once | INTRAMUSCULAR | Status: AC
  Administered 2020-09-19: 150 mg via INTRAMUSCULAR

## 2020-09-19 NOTE — Progress Notes (Signed)
Subjective:    RANEEN JAFFER - 20 y.o. female MRN [id number]  Date of birth: 2001/06/15  HPI  ADRIONNA DELCID is to establish care. Patient has a PMH significant for depression. She is currently an EMT. Starting clinicals for paramedic training. Needs paperwork filled out. Would also like to switch from OCP to Depo for contraception.   Also has concerns about her apple watch notifying her regarding tachycardia. She reports this occurs at rest. Does not seem to be related to anxiety. She has performed EKG on herself at times it has occurred while at work and read was sinus tachy. She denies associated symptoms such as vomiting, nausea, weakness, fevers/chills, diarrhea, LOC, headaches.     ROS per HPI     Health Maintenance:  Health Maintenance Due  Topic Date Due  . Hepatitis C Screening  Never done  . COVID-19 Vaccine (1) Never done  . CHLAMYDIA SCREENING  Never done  . HIV Screening  Never done     Past Medical History: Patient Active Problem List   Diagnosis Date Noted  . BMI less than 19,adult 04/14/2018  . Depressive disorder 04/14/2018  . Excessive sleepiness 04/11/2018  . Anorexia 04/11/2018  . Enlarged tonsils 12/30/2017  . Seasonal allergies 12/30/2017  . Family history of early CAD 12/30/2017      Social History   reports that she has been smoking cigarettes. She has been smoking about 0.50 packs per day. She has never used smokeless tobacco. She reports that she does not drink alcohol and does not use drugs.   Family History  family history includes Anxiety disorder in her father; Atrial fibrillation in her mother; Drug abuse in her mother; Heart attack (age of onset: 40) in her paternal grandmother; Heart attack (age of onset: 31) in her maternal grandfather; Hyperlipidemia in her father; Pulmonary fibrosis in her paternal grandfather.   Medications: reviewed and updated   Objective:   Physical Exam BP 120/82 (BP Location: Right Arm, Patient  Position: Sitting, Cuff Size: Normal)   Pulse 87   Temp 98.1 F (36.7 C) (Oral)   Resp 16   Ht 5\' 11"  (1.803 m)   Wt 148 lb 3.2 oz (67.2 kg)   SpO2 97%   BMI 20.67 kg/m  Physical Exam Constitutional:      Appearance: She is not diaphoretic.  HENT:     Head: Normocephalic and atraumatic.     Mouth/Throat:     Mouth: Oropharynx is clear and moist.      Comments: TMs normal bilaterally Eyes:     Extraocular Movements: EOM normal.     Conjunctiva/sclera: Conjunctivae normal.     Pupils: Pupils are equal, round, and reactive to light.  Neck:     Thyroid: No thyromegaly.  Cardiovascular:     Rate and Rhythm: Normal rate and regular rhythm.     Pulses: Intact distal pulses.     Heart sounds: Normal heart sounds. No murmur heard.   Pulmonary:     Effort: Pulmonary effort is normal. No respiratory distress.     Breath sounds: Normal breath sounds. No wheezing.  Abdominal:     General: Bowel sounds are normal. There is no distension.     Palpations: Abdomen is soft.     Tenderness: There is no abdominal tenderness. There is no guarding or rebound.  Musculoskeletal:        General: No deformity or edema. Normal range of motion.     Cervical back: Normal range of  motion and neck supple.  Lymphadenopathy:     Cervical: No cervical adenopathy.  Skin:    General: Skin is warm and dry.     Findings: No rash.  Neurological:     Mental Status: She is alert and oriented to person, place, and time.     Gait: Gait is intact.  Psychiatric:        Mood and Affect: Mood and affect normal.        Judgment: Judgment normal.         Assessment & Plan:   1. Encounter to establish care Reviewed patient's PMH, social history, surgical history, and medications.  - POCT URINALYSIS DIP (CLINITEK)  2. Encounter for completion of form with patient Paperwork for para-medicine clinicals completed.  - POCT URINALYSIS DIP (CLINITEK)  3. Tachycardia HR is normal at office visit. Cardiac  exam benign. Will obtain some lab work investigating for abnormalities that could cause elevations in HR. Asked patient to try to monitor for consistent scenarios if recurs. If recurs, will plan to obtain another EKG (reportedly normal at EMS station) and consider referral to cardiology.  - TSH - CBC - Basic metabolic panel  4. Screening for tuberculosis - QuantiFERON-TB Gold Plus  5. Encounter for initial prescription of injectable contraceptive - POCT urine pregnancy - medroxyPROGESTERone (DEPO-PROVERA) injection 150 mg     Marcy Siren, D.O. 09/26/2020, 3:56 PM Primary Care at [facility]

## 2020-09-19 NOTE — Progress Notes (Signed)
Estab. Care- has paperwork for school that needs completion   -Discuss getting COVID vaccines  -Discuss switching to depo shots instead of BC pills   Date last pap: within the last 3 yrs. Last Depo-Provera: years ago. Side Effects if any: none previously, 1st dose in years. Serum HCG indicated? yes- negative. Depo-Provera 150 mg IM given by: Barnett Abu, RN. Next appointment due . 4/5-4/19 Depo administered to R deltoid, pt tolerated well w/o adverse reaction noted

## 2020-09-21 LAB — QUANTIFERON-TB GOLD PLUS
QuantiFERON Mitogen Value: >10 IU/mL
QuantiFERON Nil Value: 0.01 IU/mL
QuantiFERON TB1 Ag Value: 0 IU/mL
QuantiFERON TB2 Ag Value: 0 IU/mL
QuantiFERON-TB Gold Plus: NEGATIVE

## 2020-09-21 LAB — CBC
Hematocrit: 47.3 % — ABNORMAL HIGH (ref 34.0–46.6)
Hemoglobin: 15.6 g/dL (ref 11.1–15.9)
MCH: 29.9 pg (ref 26.6–33.0)
MCHC: 33 g/dL (ref 31.5–35.7)
MCV: 91 fL (ref 79–97)
Platelets: 339 10*3/uL (ref 150–450)
RBC: 5.22 x10E6/uL (ref 3.77–5.28)
RDW: 12.7 % (ref 11.7–15.4)
WBC: 7.3 10*3/uL (ref 3.4–10.8)

## 2020-09-21 LAB — SPECIMEN STATUS REPORT

## 2020-09-21 LAB — BASIC METABOLIC PANEL
BUN/Creatinine Ratio: 8 — ABNORMAL LOW (ref 9–23)
BUN: 7 mg/dL (ref 6–20)
CO2: 23 mmol/L (ref 20–29)
Calcium: 10.3 mg/dL — ABNORMAL HIGH (ref 8.7–10.2)
Chloride: 98 mmol/L (ref 96–106)
Creatinine, Ser: 0.83 mg/dL (ref 0.57–1.00)
GFR calc Af Amer: 117 mL/min/{1.73_m2} (ref >59)
GFR calc non Af Amer: 102 mL/min/{1.73_m2} (ref >59)
Glucose: 73 mg/dL (ref 65–99)
Potassium: 4.1 mmol/L (ref 3.5–5.2)
Sodium: 138 mmol/L (ref 134–144)

## 2020-09-21 LAB — TSH: TSH: 0.659 u[IU]/mL (ref 0.450–4.500)

## 2020-09-29 ENCOUNTER — Encounter: Payer: Self-pay | Admitting: Internal Medicine

## 2020-09-29 ENCOUNTER — Telehealth (INDEPENDENT_AMBULATORY_CARE_PROVIDER_SITE_OTHER): Payer: Managed Care, Other (non HMO) | Admitting: Internal Medicine

## 2020-09-29 VITALS — Ht 71.0 in | Wt 140.0 lb

## 2020-09-29 DIAGNOSIS — F32A Depression, unspecified: Secondary | ICD-10-CM | POA: Diagnosis not present

## 2020-09-29 DIAGNOSIS — F419 Anxiety disorder, unspecified: Secondary | ICD-10-CM | POA: Diagnosis not present

## 2020-09-29 NOTE — Progress Notes (Signed)
Virtual Visit via Telephone Note  I connected with Ashlee Ramirez, on 09/29/2020 at 9:31 AM by telephone due to the COVID-19 pandemic and verified that I am speaking with the correct person using two identifiers.   Consent: I discussed the limitations, risks, security and privacy concerns of performing an evaluation and management service by telephone and the availability of in person appointments. I also discussed with the patient that there may be a patient responsible charge related to this service. The patient expressed understanding and agreed to proceed.   Location of Patient: Home  Location of Provider: Home    Persons participating in Telemedicine visit: Tessie Fass Dr. Earlene Plater      History of Present Illness: Patient wants a referral to psychiatrist. 2-3 year ago was established with psych. Was diagnosed with anxiety and depression. Was doing well so she stopped going thinking that she could manage it on her own. Figured out that she could not. She was previously Prozac, [place], [place]. Was established with a counselor and would like to be again.   Depression screen [facility] 2/9 09/29/2020 09/19/2020 07/02/2019  Decreased Interest 2 2 0  Down, Depressed, Hopeless 2 1 0  PHQ - 2 Score 4 3 0  Altered sleeping 2 3 0  Tired, decreased energy 2 3 0  Change in appetite - 2 0  Feeling bad or failure about yourself  2 0 0  Trouble concentrating 2 3 0  Moving slowly or fidgety/restless 0 2 0  Suicidal thoughts 0 0 0  PHQ-9 Score 12 16 0  Difficult doing work/chores Very difficult Somewhat difficult -  Some recent data might be hidden     GAD 7 : Generalized Anxiety Score 09/29/2020 09/19/2020 07/08/2018 05/15/2018  Nervous, Anxious, on Edge 1 2 3 2   Control/stop worrying 1 1 2 1   Worry too much - different things 1 2 3  0  Trouble relaxing 1 3 2 3   Restless 1 3 3 3   Easily annoyed or irritable 1 2 2 3   Afraid - awful might happen 1 0 0 1  Total GAD 7  Score 7 13 15 13   Anxiety Difficulty Somewhat difficult Somewhat difficult Somewhat difficult Very difficult    Past Medical History:  Diagnosis Date  . Allergy   . Anxiety   . Back pain   . Depression    Allergies  Allergen Reactions  . Aspirin Other (See Comments)    nosebleed    Current Outpatient Medications on File Prior to Visit  Medication Sig Dispense Refill  . albuterol (VENTOLIN HFA) 108 (90 Base) MCG/ACT inhaler Inhale 2 puffs into the lungs every 6 (six) hours as needed for wheezing or shortness of breath. (Patient not taking: Reported on 09/29/2020) 8 g 0  . benzonatate (TESSALON PERLES) 100 MG capsule Take 1 capsule (100 mg total) by mouth 3 (three) times daily as needed. (Patient not taking: No sig reported) 20 capsule 0  . predniSONE (STERAPRED UNI-PAK 21 TAB) 10 MG (21) TBPK tablet Use as directed (Patient not taking: No sig reported) 21 tablet 0  . traZODone (DESYREL) 50 MG tablet Take 0.5-1 tablets (25-50 mg total) by mouth at bedtime as needed for sleep. 30 tablet 2   No current facility-administered medications on file prior to visit.    Observations/Objective: NAD. Speaking clearly.  Work of breathing normal.  Alert and oriented. Mood appropriate.   Assessment and Plan: 1. Anxiety and depression PHQ-9 score concerning for moderate to severe  depression. GAD-7 score concerning for mild to moderate anxiety. Patient would like to establish with psych for medication management. In the meantime, have offered appointment with Jenel Lucks, LCSW and patient agreeable. No SI or safety concerns. Resources given.  - Ambulatory referral to Psychiatry   Follow Up Instructions: Visit with Jenel Lucks, LCSW    I discussed the assessment and treatment plan with the patient. The patient was provided an opportunity to ask questions and all were answered. The patient agreed with the plan and demonstrated an understanding of the instructions.   The patient was advised  to call back or seek an in-person evaluation if the symptoms worsen or if the condition fails to improve as anticipated.     I provided 7 minutes total of non-face-to-face time during this encounter including median intraservice time, reviewing previous notes, investigations, ordering medications, medical decision making, coordinating care and patient verbalized understanding at the end of the visit.    Marcy Siren, D.O. Primary Care at [facility]  09/29/2020, 9:31 AM

## 2020-09-29 NOTE — Progress Notes (Signed)
Discuss most recent lab results and referral for psychiatrist

## 2020-09-29 NOTE — Patient Instructions (Signed)
Behavioral Health Resources:   What if I or someone I know is in crisis?  . If you are thinking about harming yourself or having thoughts of suicide, or if you know someone who is, seek help right away.  . Call your doctor or mental health care provider.  . Call 911 or go to a hospital emergency room to get immediate help, or ask a friend or family member to help you do these things; IF YOU ARE IN [place], YOU MAY GO TO WALK-IN URGENT CARE 24/7 at [facility] (see below)  . Call the [place] National Suicide Prevention Lifeline's toll-free, 24-hour hotline at 1-800-273-TALK (913)751-9539) or TTY: 1-800-799-4 TTY (681) 649-6760) to talk to a trained counselor.  . If you are in crisis, make sure you are not left alone.   . If someone else is in crisis, make sure he or she is not left alone   24 Hour :   [facility]  [address] [place], [place] [address] [phone] or [place] 24/7  [facility] Mobile Crisis: [phone]  [place] National Suicide Hotline: 772-385-9177  [facility] [employer] (Domestic Violence, Rape & Victim Assistance)  (848)656-8179  [place]  [address], [place]  [address]   (313)209-3544 or (724) [phone]   [place]: [phone] (8am-4pm) or [phone] (after hours)        [facility], [address], [place], [place] Fax: [phone] [email or web address] *Interpreters available *Accepts all insurance and uninsured for Urgent Care needs *Accepts Medicaid and uninsured for outpatient treatment   [facility]   Mon-Fri: 8am-5pm [place], [place], [place]); [phone]) [email or web address].com  *Accepts Medicare  [facility] [address] Mason, Fri: 8am-4pm [place], [place], [place] [address] [phone] (phone); [phone] (fax) TaskTown.es  *[place] Mon-Fri: 9am-5pm  [address] [place], [place] (phone); [phone]  [email or web address]  *Accepts Medicaid  Jinny Blossom [facility] [address] Johnette Abraham [place], [place] SalonLookup.es   [facility] of the [place], 8:30am-12pm/1pm-2:30pm [address], [place], [place] (phone); [phone] (fax) www.[email or web address]  *Accepts Medicaid, sliding-scale*Bilingual services available  [facility] Mon-Fri, 8am-7pm [place], [place]  [phone](phone); [phone]) www.[email or web address]  *Accepts Medicaid *Bilingual services available  [facility] Mon-Fri: 8am-5pm, Saturday by appointment only [place], [place], [place] (phone); [phone] (fax) [email or web address]   [facility] [address], [place], [place], [place] [email or web address]  *Free & reduced services for uninsured and underinsured individuals *Bilingual services for Spanish-speaking clients 21 and under  [facility], [address] [place], [place]); [phone]) RunningConvention.de  *Bring your own interpreter at first visit *Accepts Medicare and [facility]  [place] Mon-Fri: 9am-5:30pm [address], [place], [place], [place] (phone), [phone] (fax) After hours crisis line: [phone] www.[email or web address]  *Accepts Medicare and Medicaid  [employer], 8am-6pm [address] [place], [place] (phone);  [phone] (fax) http://[email or web address]  *Subsidized costs available  [facility] Mon-Fri: 8am-4pm [address], [place], [place] [phone](phone); [phone]) www.psychotherapeuticservices.com  *Accepts Medicaid  [facility] Same day access hours: Mon-Fri, 8:30-3pm Crisis hours: Mon-Fri, 8am-5pm [address] [place], [place] [phone]  [facility] [employer] Same day access hours: Mon-Fri,  8:30-3pm Crisis hours: Mon-Fri, 8am-8pm [address], [place], [place] [phone] (phone); [phone] (fax) www.[email or web address]  *Accepts Medicaid and Medicare  [facility] Mon, Wed, Fri: 9am-9pm Tues, Thurs: 9am-6pm [address], [place], [place]  [phone] (phone); [phone] (fax) [email or web address]://[email or web address]  *(Accepts Medicare and Medicaid; payment plans available)*Bilingual services available  [facility] [address], [place], [place] [phone] (phone); [phone] (fax) www.[email or web address]   [facility] [address], [place], [place]  [phone]  [email or web address]  *Bilingual services available  [facility] Mon-Thurs: 8am-8pm [address], [place], [place] [phone] (phone); [phone] (fax) [email or web address]  *Accepts Medicaid*Bilingual services available  [facility] [address] [place], [place] [phone] (phone) [email or web address]://[email or web address]  *Accepts Medicaid *Bilingual services available  [facility] Mon-Fri, 9am-4:45pm [address], [place], [place] [phone] (phone); [phone] (fax) http://[email or web address]  *Accepts Medicare  [facility] Mon-Thurs: 8:30-8pm, Fri: 8:30am-7pm [address], [place], [place] (3rd floor) [phone] (phone); [phone]  (fax) [email or web address]  *Accepts Medicaid; income-based reduced rates available  [facility] Mon-Fri: 8am-5pm [address], [place],  [address] [phone] (phone); [phone] (fax) [email or web address]://[email or web address]  *Accepts Medicaid*Bilingual services available   MHAG ([facility]ental [facility] of [place])  [address],  [phone] www.mhag.org  *Provides direct services to individuals in recovery from mental illness, including support groups, recovery skills classes, and one on one peer support  NAMI (National Alliance on Mental Illness) [place] NAMI helpline: 336-370-4264  NAMI  helpline: 1-800-451-9682 https://namiguilford.org  *A community hub for information relating to local resources and services for the friends and families of individuals living alongside a mental health condition, as well as the individuals themselves. Classes and support groups also provided    

## 2020-10-26 ENCOUNTER — Other Ambulatory Visit: Payer: Self-pay

## 2020-10-26 ENCOUNTER — Ambulatory Visit: Payer: Managed Care, Other (non HMO) | Admitting: Licensed Clinical Social Worker

## 2020-10-26 DIAGNOSIS — F32A Depression, unspecified: Secondary | ICD-10-CM

## 2020-10-26 DIAGNOSIS — F419 Anxiety disorder, unspecified: Secondary | ICD-10-CM

## 2020-10-26 NOTE — BH Specialist Note (Signed)
MSW Intern spoke with Ashlee Ramirez at an in-person appointment regarding a referral to psychiatry and counseling services. Pt stated that they were experiencing anxiety and depression (low energy, fidgeting with her hands) symptoms since stopping therapy and medication management in early 2021. 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 MSW Intern and LCSW Jenel Lucks sent referral to Dr. Earlene Plater for an internal referral to [facility] through [facility] (for psychiatry and medication management), and to [facility] in [place] (for therapy services). Pt was satisfied with these referrals, and stated they are not having thoughts of harming themselves or others; in addition, pt stated they have good support systems in place as well with family, friends, and a potential relationship.

## 2020-11-27 ENCOUNTER — Telehealth: Payer: Managed Care, Other (non HMO) | Admitting: Physician Assistant

## 2020-11-27 DIAGNOSIS — J4541 Moderate persistent asthma with (acute) exacerbation: Secondary | ICD-10-CM

## 2020-11-27 MED ORDER — PREDNISONE 20 MG PO TABS: 40.0000 mg | ORAL_TABLET | Freq: Every day | ORAL | 0 refills | Status: DC

## 2020-11-27 MED ORDER — ALBUTEROL SULFATE (2.5 MG/3ML) 0.083% IN NEBU: 2.5000 mg | INHALATION_SOLUTION | Freq: Four times a day (QID) | RESPIRATORY_TRACT | 1 refills | Status: DC | PRN

## 2020-11-27 NOTE — Progress Notes (Signed)
I have spent 5 minutes in review of e-visit questionnaire, review and updating patient chart, medical decision making and response to patient.   Lucina Betty Cody Marquis Down, PA-C    

## 2020-11-27 NOTE — Progress Notes (Signed)
Visit for Asthma  Based on what you have shared with me, it looks like you may have a flare up of your asthma.  Asthma is a chronic (ongoing) lung disease which results in airway obstruction, inflammation and hyper-responsiveness.   Asthma symptoms vary from person to person, with common symptoms including nighttime awakening and decreased ability to participate in normal activities as a result of shortness of breath. It is often triggered by changes in weather, changes in the season, changes in air temperature, or inside (home, school, daycare or work) allergens such as animal dander, mold, mildew, woodstoves or cockroaches.   It can also be triggered by hormonal changes, extreme emotion, physical exertion or an upper respiratory tract illness.     It is important to identify the trigger, and then eliminate or avoid the trigger if possible.   If you have been prescribed medications to be taken on a regular basis, it is important to follow the asthma action plan and to follow guidelines to adjust medication in response to increasing symptoms of decreased peak expiratory flow rate  Treatment: I have prescribed: Albuterol (Proventil) (2.5 mg in 3 mL) 0.083 % Take by nebulization solution every six hours as needed for wheezing or shortness of breath and Prednisone 40mg  by mouth per day for 5 - 7 days.  You also need to go ahead and schedule a follow-up with your primary provider for reassessment and adjustment of daily medications for better asthma control. This will help prevent these exacerbations and breakthrough symptoms.   HOME CARE . Only take medications as instructed by your medical team. . Consider wearing a mask or scarf to improve breathing air temperature have been shown to decrease irritation and decrease exacerbations . Get rest. . Taking a steamy shower or using a  humidifier may help nasal congestion sand ease sore throat pain. You can place a towel over your head and breathe in the steam from hot water coming from a faucet. . Using a saline nasal spray works much the same way.  . Cough drops, hare candies and sore throat lozenges may ease your cough.  . Avoid close contacts especially the very you and the elderly . Cover your mouth if you cough or sneeze . Always remember to wash your hands.    GET HELP RIGHT AWAY IF: . You develop worsening symptoms; breathlessness at rest, drowsy, confused or agitated, unable to speak in full sentences . You have coughing fits . You develop a severe headache or visual changes . You develop shortness of breath, difficulty breathing or start having chest pain . Your symptoms persist after you have completed your treatment plan . If your symptoms do not improve within 10 days  MAKE SURE YOU . Understand these instructions. . Will watch your condition. . Will get help right away if you are not doing well or get worse.   Your e-visit answers were reviewed by a board certified advanced clinical practitioner to complete your personal care plan, Depending upon the condition, your plan could have included both over the counter or prescription medications.  Please review your pharmacy choice. Your safety is important to . If you have drug allergies check your prescription carefully. You can use MyChart to ask questions about today's visit, request a non-urgent call back, or ask for a work or school excuse for 24 hours related to this e-Visit. If it has been greater than 24 hours you will need to follow up with your provider, or enter  a new e-Visit to address those concerns.  You will get an e-mail in the next two days asking about your experience. I hope that your e-visit has been valuable and will speed your recovery. Thank you for using e-visits.

## 2020-12-06 ENCOUNTER — Ambulatory Visit: Payer: Managed Care, Other (non HMO)

## 2021-01-05 ENCOUNTER — Telehealth: Payer: Managed Care, Other (non HMO) | Admitting: Physician Assistant

## 2021-01-05 DIAGNOSIS — H66002 Acute suppurative otitis media without spontaneous rupture of ear drum, left ear: Secondary | ICD-10-CM

## 2021-01-05 MED ORDER — AMOXICILLIN 500 MG PO CAPS: 500.0000 mg | ORAL_CAPSULE | Freq: Three times a day (TID) | ORAL | 0 refills | Status: AC

## 2021-01-05 NOTE — Progress Notes (Signed)
E Visit for Ear Infection  We are sorry that you are not feeling well. Here is how we plan to help!  Based on what you have told me I believe you may have a bacterial infection of the inner ear. I have prescribed Amoxicillin 500mg  Take 1 tablet three times daily x 10 days, until completed.  If you have a fever 102 and up and significantly worsening symptoms, this could indicate a more serious infection moving to the middle/inner and needs face to face evaluation in an office by a provider.  Your symptoms should improve over the next 3 days and should resolve in about 7 days.  HOME CARE:   Wash your hands frequently.  Do not place the tip of the bottle on your ear or touch it with your fingers.  You can take Acetominophen 650 mg every 4-6 hours as needed for pain.  If pain is severe or moderate, you can apply a heating pad (set on low) or hot water bottle (wrapped in a towel) to outer ear for 20 minutes.  This will also increase drainage.  Avoid ear plugs  Do not use Q-tips  After showers, help the water run out by tilting your head to one side.  GET HELP RIGHT AWAY IF:   Fever is over 102.2 degrees.  You develop progressive ear pain or hearing loss.  Ear symptoms persist longer than 3 days after treatment.  MAKE SURE YOU:   Understand these instructions.  Will watch your condition.  Will get help right away if you are not doing well or get worse.  TO PREVENT SWIMMER'S EAR:  Use a bathing cap or custom fitted swim molds to keep your ears dry.  Towel off after swimming to dry your ears.  Tilt your head or pull your earlobes to allow the water to escape your ear canal.  If there is still water in your ears, consider using a hairdryer on the lowest setting.  Thank you for choosing an e-visit. Your e-visit answers were reviewed by a board certified advanced clinical practitioner to complete your personal care plan. Depending upon the condition, your plan could have  included both over the counter or prescription medications. Please review your pharmacy choice. Be sure that the pharmacy you have chosen is open so that you can pick up your prescription now.  If there is a problem you may message your provider in MyChart to have the prescription routed to another pharmacy. Your safety is important to . If you have drug allergies check your prescription carefully.  For the next 24 hours, you can use MyChart to ask questions about today's visit, request a non-urgent call back, or ask for a work or school excuse from your e-visit provider. You will get an email in the next two days asking about your experience. I hope that your e-visit has been valuable and will speed your recovery.  I provided 6 minutes of non face-to-face time during this encounter for chart review and documentation.

## 2021-02-13 ENCOUNTER — Emergency Department
Admission: EM | Admit: 2021-02-13 | Discharge: 2021-02-13 | Disposition: A | Discharge status: Home / Self Care | Payer: Managed Care, Other (non HMO) | Attending: Emergency Medicine | Admitting: Emergency Medicine

## 2021-02-13 ENCOUNTER — Other Ambulatory Visit: Payer: Self-pay

## 2021-02-13 ENCOUNTER — Emergency Department: Payer: Managed Care, Other (non HMO)

## 2021-02-13 DIAGNOSIS — R0789 Other chest pain: Secondary | ICD-10-CM | POA: Diagnosis not present

## 2021-02-13 DIAGNOSIS — R002 Palpitations: Secondary | ICD-10-CM | POA: Insufficient documentation

## 2021-02-13 DIAGNOSIS — Z87891 Personal history of nicotine dependence: Secondary | ICD-10-CM | POA: Insufficient documentation

## 2021-02-13 DIAGNOSIS — R000 Tachycardia, unspecified: Secondary | ICD-10-CM

## 2021-02-13 LAB — MAGNESIUM: Magnesium: 2.1 mg/dL (ref 1.7–2.4)

## 2021-02-13 LAB — TSH: TSH: 0.696 u[IU]/mL (ref 0.350–4.500)

## 2021-02-13 LAB — TROPONIN I (HIGH SENSITIVITY): Troponin I (High Sensitivity): <2 ng/L (ref <18)

## 2021-02-13 LAB — CBC
HCT: 38.1 % (ref 36.0–46.0)
Hemoglobin: 13 g/dL (ref 12.0–15.0)
MCH: 31.6 pg (ref 26.0–34.0)
MCHC: 34.1 g/dL (ref 30.0–36.0)
MCV: 92.5 fL (ref 80.0–100.0)
Platelets: 275 10*3/uL (ref 150–400)
RBC: 4.12 MIL/uL (ref 3.87–5.11)
RDW: 12.9 % (ref 11.5–15.5)
WBC: 7.2 10*3/uL (ref 4.0–10.5)
nRBC: 0 % (ref 0.0–0.2)

## 2021-02-13 LAB — BASIC METABOLIC PANEL
Anion gap: 6 (ref 5–15)
BUN: 8 mg/dL (ref 6–20)
CO2: 27 mmol/L (ref 22–32)
Calcium: 9.5 mg/dL (ref 8.9–10.3)
Chloride: 106 mmol/L (ref 98–111)
Creatinine, Ser: 0.76 mg/dL (ref 0.44–1.00)
GFR, Estimated: >60 mL/min (ref >60)
Glucose, Bld: 102 mg/dL — ABNORMAL HIGH (ref 70–99)
Potassium: 4.3 mmol/L (ref 3.5–5.1)
Sodium: 139 mmol/L (ref 135–145)

## 2021-02-13 NOTE — ED Triage Notes (Signed)
Pt come with c/o rapid HR. Pt states she was standing and her HR started racing. Pt states she became SOB, CP and palpitations. Pt states this has been going on for about week.  Pt states her HR got up to 180. Pt states it slows down once she is sitting.

## 2021-02-13 NOTE — ED Provider Notes (Signed)
[facility] Emergency Department Provider Note   ____________________________________________   Event Date/Time   First MD Initiated Contact with Patient 02/13/21 1827     (approximate)  I have reviewed the triage vital signs and the nursing notes.   HISTORY  Chief Complaint Tachycardia    HPI Ashlee Ramirez is a 20 y.o. female with past medical history of anxiety and anorexia who presents to the ED complaining of palpitations.  Patient reports that for the past 3 to 4 days she has been dealing with intermittent sensation of her heart racing along with discomfort in her chest.  She describes the discomfort as a dull ache that is only present when she feels like her heart is racing.  She denies any fevers, cough, or shortness of breath.  She states that her heart rate seems to be normal if she is seated and at rest, but as soon as she stands up to move around it can get as high as 180.  She has been tracking her heart rate on her apple watch and it varies between 120 and 180 when she is up moving around.  She states she has never had similar symptoms before, admits to having a [facility] today but overall denies significant caffeine consumption.  She does not take any medications on a regular basis, denies any drug use.        Past Medical History:  Diagnosis Date   Allergy    Anxiety    Back pain    Depression     Patient Active Problem List   Diagnosis Date Noted   BMI less than 19,adult 04/14/2018   Depressive disorder 04/14/2018   Excessive sleepiness 04/11/2018   Anorexia 04/11/2018   Enlarged tonsils 12/30/2017   Seasonal allergies 12/30/2017   Family history of early CAD 12/30/2017    History reviewed. No pertinent surgical history.  Prior to Admission medications   Medication Sig Start Date End Date Taking? Authorizing Provider  albuterol (PROVENTIL) (2.5 MG/3ML) 0.083% nebulizer solution Take 3 mLs (2.5 mg total) by nebulization  every 6 (six) hours as needed for wheezing or shortness of breath. 11/27/20   Waldon Merl, PA-C  albuterol (VENTOLIN HFA) 108 (90 Base) MCG/ACT inhaler Inhale 2 puffs into the lungs every 6 (six) hours as needed for wheezing or shortness of breath. Patient not taking: Reported on 09/29/2020 09/11/20   Junie Spencer, FNP    Allergies Aspirin  Family History  Problem Relation Age of Onset   Pulmonary fibrosis Paternal Grandfather    Heart attack Paternal Grandmother 25   Hyperlipidemia Father    Anxiety disorder Father    Drug abuse Mother    Atrial fibrillation Mother    Heart attack Maternal Grandfather 25    Social History Social History   Tobacco Use   Smoking status: Former    Packs/day: 0.50    Pack years: 0.00    Types: Cigarettes   Smokeless tobacco: Never  Vaping Use   Vaping Use: Some days   Substances: Nicotine  Substance Use Topics   Alcohol use: No   Drug use: No    Review of Systems  Constitutional: No fever/chills Eyes: No visual changes. ENT: No sore throat. Cardiovascular: Positive for palpitations and chest pain. Respiratory: Denies shortness of breath. Gastrointestinal: No abdominal pain.  No nausea, no vomiting.  No diarrhea.  No constipation. Genitourinary: Negative for dysuria. Musculoskeletal: Negative for back pain. Skin: Negative for rash. Neurological: Negative for  headaches, focal weakness or numbness.  ____________________________________________   PHYSICAL EXAM:  VITAL SIGNS: ED Triage Vitals  Enc Vitals Group     BP 02/13/21 1638 (!) 138/94     Pulse Rate 02/13/21 1638 91     Resp 02/13/21 1638 18     Temp 02/13/21 1638 98.2 F (36.8 C)     Temp Source 02/13/21 1638 Oral     SpO2 02/13/21 1638 100 %     Weight --      Height --      Head Circumference --      Peak Flow --      Pain Score 02/13/21 1638 0     Pain Loc --      Pain Edu? --      Excl. in GC? --     Constitutional: Alert and oriented. Eyes:  Conjunctivae are normal. Head: Atraumatic. Nose: No congestion/rhinnorhea. Mouth/Throat: Mucous membranes are moist. Neck: Normal ROM Cardiovascular: Normal rate, regular rhythm. Grossly normal heart sounds.  2+ radial pulses bilaterally. Respiratory: Normal respiratory effort.  No retractions. Lungs CTAB. Gastrointestinal: Soft and nontender. No distention. Genitourinary: deferred Musculoskeletal: No lower extremity tenderness nor edema. Neurologic:  Normal speech and language. No gross focal neurologic deficits are appreciated. Skin:  Skin is warm, dry and intact. No rash noted. Psychiatric: Mood and affect are normal. Speech and behavior are normal.  ____________________________________________   LABS (all labs ordered are listed, but only abnormal results are displayed)  Labs Reviewed  BASIC METABOLIC PANEL - Abnormal; Notable for the following components:      Result Value   Glucose, Bld 102 (*)    All other components within normal limits  CBC  TSH  MAGNESIUM  POC URINE PREG, ED  TROPONIN I (HIGH SENSITIVITY)  TROPONIN I (HIGH SENSITIVITY)   ____________________________________________  EKG  ED ECG REPORT I, Chesley Noon, the attending physician, personally viewed and interpreted this ECG.   Date: 02/13/2021  EKG Time: 16:43  Rate: 98  Rhythm: normal sinus rhythm  Axis: RAD  Intervals:none  ST&T Change: None   PROCEDURES  Procedure(s) performed (including Critical Care):  Procedures   ____________________________________________   INITIAL IMPRESSION / ASSESSMENT AND PLAN / ED COURSE      20 year old female with past medical history of anxiety and anorexia who presents to the ED complaining of intermittent palpitations and chest discomfort while up and moving for the past 3 to 4 days.  EKG shows normal sinus rhythm with right axis deviation but no ischemic changes.  Initial troponin is negative and I have low suspicion for ACS.  CBC and BMP are  unremarkable.  We will observe patient on cardiac monitor, add on magnesium and TSH.  Magnesium and TSH are within normal limits, patient's heart rate appears to have normalized and is consistently around 80 in normal sinus rhythm.  She stood up and took a few steps while on cardiac monitor, heart rate increased to no greater than 105.  Patient is appropriate for discharge home with cardiology follow-up at this time, she was counseled to return to the ED for new or worsening symptoms.  Patient agrees with plan.      ____________________________________________   FINAL CLINICAL IMPRESSION(S) / ED DIAGNOSES  Final diagnoses:  Palpitations  Tachycardia     ED Discharge Orders     None        Note:  This document was prepared using Dragon voice recognition software and may include unintentional dictation errors.  Chesley Noon, MD 02/13/21 2026

## 2021-03-15 ENCOUNTER — Ambulatory Visit (INDEPENDENT_AMBULATORY_CARE_PROVIDER_SITE_OTHER): Payer: Managed Care, Other (non HMO)

## 2021-03-15 ENCOUNTER — Ambulatory Visit (INDEPENDENT_AMBULATORY_CARE_PROVIDER_SITE_OTHER): Payer: Managed Care, Other (non HMO) | Admitting: Cardiology

## 2021-03-15 ENCOUNTER — Other Ambulatory Visit: Payer: Self-pay

## 2021-03-15 ENCOUNTER — Encounter: Payer: Self-pay | Admitting: Cardiology

## 2021-03-15 VITALS — BP 106/66 | HR 68 | Ht 71.0 in | Wt 139.0 lb

## 2021-03-15 DIAGNOSIS — R002 Palpitations: Secondary | ICD-10-CM

## 2021-03-15 DIAGNOSIS — R000 Tachycardia, unspecified: Secondary | ICD-10-CM | POA: Diagnosis not present

## 2021-03-15 DIAGNOSIS — Z7289 Other problems related to lifestyle: Secondary | ICD-10-CM

## 2021-03-15 NOTE — Patient Instructions (Signed)
Medication Instructions:  Your physician recommends that you continue on your current medications as directed. Please refer to the Current Medication list given to you today.  *If you need a refill on your cardiac medications before your next appointment, please call your pharmacy*   Lab Work: None ordered If you have labs (blood work) drawn today and your tests are completely normal, you will receive your results only by: MyChart Message (if you have MyChart) OR A paper copy in the mail If you have any lab test that is abnormal or we need to change your treatment, we will call you to review the results.   Testing/Procedures:  Your physician has recommended that you wear a Zio monitor for 2 weeks.   This monitor is a medical device that records the heart's electrical activity. Doctors most often use these monitors to diagnose arrhythmias. Arrhythmias are problems with the speed or rhythm of the heartbeat. The monitor is a small device applied to your chest. You can wear one while you do your normal daily activities. While wearing this monitor if you have any symptoms to push the button and record what you felt. Once you have worn this monitor for the period of time provider prescribed (Usually 14 days), you will return the monitor device in the postage paid box. Once it is returned they will download the data collected and provide [place] with a report which the provider will then review and we will call you with those results. Important tips:  Avoid showering during the first 24 hours of wearing the monitor. Avoid excessive sweating to help maximize wear time. Do not submerge the device, no hot tubs, and no swimming pools. Keep any lotions or oils away from the patch. After 24 hours you may shower with the patch on. Take brief showers with your back facing the shower head.  Do not remove patch once it has been placed because that will interrupt data and decrease adhesive wear time. Push the  button when you have any symptoms and write down what you were feeling. Once you have completed wearing your monitor, remove and place into box which has postage paid and place in your outgoing mailbox.  If for some reason you have misplaced your box then call our office and we can provide another box and/or mail it off for you.    Follow-Up: At [facility], you and your health needs are our priority.  As part of our continuing mission to provide you with exceptional heart care, we have created designated Provider Care Teams.  These Care Teams include your primary Cardiologist (physician) and Advanced Practice Providers (APPs -  Physician Assistants and Nurse Practitioners) who all work together to provide you with the care you need, when you need it.  We recommend signing up for the patient portal called "MyChart".  Sign up information is provided on this After Visit Summary.  MyChart is used to connect with patients for Virtual Visits (Telemedicine).  Patients are able to view lab/test results, encounter notes, upcoming appointments, etc.  Non-urgent messages can be sent to your provider as well.   To learn more about what you can do with MyChart, go to [email or web address].    Your next appointment:   6 week(s)  The format for your next appointment:   In Person  Provider:   Debbe Odea, MD  ONLY   Other Instructions

## 2021-03-15 NOTE — Progress Notes (Signed)
Cardiology Office Note:    Date:  03/15/2021   ID:  Ashlee Ramirez, DOB 2001-08-06, MRN [id number]  PCP:  Aviva Kluver   [facility] Providers Cardiologist:  Debbe Odea, MD     Referring MD: Chesley Noon, MD   Chief Complaint  Patient presents with   New Patient (Initial Visit)    Referred by ED for Palpitations and tachycardia. Meds reviewed verbally with patient.    Ashlee Ramirez is a 20 y.o. female who is being seen today for the evaluation of palpitations at the request of Chesley Noon, MD.   History of Present Illness:    Ashlee Ramirez is a 20 y.o. female with a hx of anxiety, engages in e-cigarettes use who presents due to palpitations.  Patient has noticed elevated heart rates and palpitations occurring over the past 2 months.  Symptoms typically occur with exertion.  She has noticed heart rates up to 170s with minimal exertion.  Sometimes moving from sitting to standing position causes heart rate to move from baseline of 60s to 150s.  She has also noticed dizziness, some shortness of breath when her heart rate is over 130.  She feels normal at rest.  She denies any personal or immediate family history of heart disease.  Currently vapes.  She is an Animator.  Past Medical History:  Diagnosis Date   Allergy    Anxiety    Back pain    Depression     History reviewed. No pertinent surgical history.  Current Medications: Current Meds  Medication Sig   levonorgestrel-ethinyl estradiol (ALESSE) 0.1-20 MG-MCG tablet Take 1 tablet by mouth every morning.     Allergies:   Aspirin   Social History   Socioeconomic History   Marital status: Single    Spouse name: Not on file   Number of children: Not on file   Years of education: Not on file   Highest education level: Not on file  Occupational History   Not on file  Tobacco Use   Smoking status: Former    Packs/day: 0.50    Types: Cigarettes   Smokeless tobacco: Never  Vaping Use    Vaping Use: Some days   Substances: Nicotine  Substance and Sexual Activity   Alcohol use: No   Drug use: No   Sexual activity: Yes    Birth control/protection: Pill, Condom  Other Topics Concern   Not on file  Social History Narrative   Not on file   Social Determinants of Health   Financial Resource Strain: Not on file  Food Insecurity: Not on file  Transportation Needs: Not on file  Physical Activity: Not on file  Stress: Not on file  Social Connections: Not on file     Family History: The patient's family history includes Anxiety disorder in her father; Atrial fibrillation in her mother; Drug abuse in her mother; Heart attack (age of onset: 21) in her paternal grandmother; Heart attack (age of onset: 18) in her maternal grandfather; Hyperlipidemia in her father; Pulmonary fibrosis in her paternal grandfather.  ROS:   Please see the history of present illness.     All other systems reviewed and are negative.  EKGs/Labs/Other Studies Reviewed:    The following studies were reviewed today:  HR BP SYMPTOMS  Lying 71  121/72 -  Sitting 90  101/63 dizzy  Standing ( ) 96 142/90 -  Standing ( ) 94 115/83 -     EKG:  EKG is  ordered today.  The ekg ordered today demonstrates normal sinus rhythm, normal ECG.  Recent Labs: 02/13/2021: BUN 8; Creatinine, Ser 0.76; Hemoglobin 13.0; Magnesium 2.1; Platelets 275; Potassium 4.3; Sodium 139; TSH 0.696  Recent Lipid Panel No results found for: CHOL, TRIG, HDL, CHOLHDL, VLDL, LDLCALC, LDLDIRECT   Risk Assessment/Calculations:          Physical Exam:    VS:  BP 106/66 (BP Location: Left Arm, Patient Position: Sitting, Cuff Size: Normal)   Pulse 68   Ht 5\' 11"  (1.803 m)   Wt 139 lb (63 kg)   SpO2 99%   BMI 19.39 kg/m     Wt Readings from Last 3 Encounters:  03/15/21 139 lb (63 kg)  09/29/20 140 lb (63.5 kg)  09/19/20 148 lb 3.2 oz (67.2 kg)     GEN:  Well nourished, well developed in no acute distress HEENT:  Normal NECK: No JVD; No carotid bruits LYMPHATICS: No lymphadenopathy CARDIAC: RRR, no murmurs, rubs, gallops RESPIRATORY:  Clear to auscultation without rales, wheezing or rhonchi  ABDOMEN: Soft, non-tender, non-distended MUSCULOSKELETAL:  No edema; No deformity  SKIN: Warm and dry NEUROLOGIC:  Alert and oriented x 3 PSYCHIATRIC:  Normal affect   ASSESSMENT:    1. Palpitations   2. Tachycardia   3. Engages in vaping    PLAN:    In order of problems listed above:  Palpitations, tachycardia with minimal exertion.  Place cardiac monitor to evaluate any significant arrhythmias.  Orthostatic vitals performed did show some evidence of orthostasis from lying to sitting associated with some dizziness. Orthostasis also noted with standing 09/21/20.  No evidence of orthostasis noted with standing from sitting position., no evidence for tachycardia noted with changes in any position. Hydration advised for possible orthostasis. Patient may have inappropriate sinus tach.  May consider beta-blocker after cardiac monitor if tachycardia is confirmed.   Currently engages in vaping, cessation advised.   Follow-up after cardiac monitor.      Medication Adjustments/Labs and Tests Ordered: Current medicines are reviewed at length with the patient today.  Concerns regarding medicines are outlined above.  Orders Placed This Encounter  Procedures   LONG TERM MONITOR (3-14 DAYS)   EKG 12-Lead    No orders of the defined types were placed in this encounter.   Patient Instructions  Medication Instructions:  Your physician recommends that you continue on your current medications as directed. Please refer to the Current Medication list given to you today.  *If you need a refill on your cardiac medications before your next appointment, please call your pharmacy*   Lab Work: None ordered If you have labs (blood work) drawn today and your tests are completely normal, you will receive your results  only by: MyChart Message (if you have MyChart) OR A paper copy in the mail If you have any lab test that is abnormal or we need to change your treatment, we will call you to review the results.   Testing/Procedures:  Your physician has recommended that you wear a Zio monitor for 2 weeks.   This monitor is a medical device that records the heart's electrical activity. Doctors most often use these monitors to diagnose arrhythmias. Arrhythmias are problems with the speed or rhythm of the heartbeat. The monitor is a small device applied to your chest. You can wear one while you do your normal daily activities. While wearing this monitor if you have any symptoms to push the button and record what you felt. Once you have worn this monitor  for the period of time provider prescribed (Usually 14 days), you will return the monitor device in the postage paid box. Once it is returned they will download the data collected and provide [place] with a report which the provider will then review and we will call you with those results. Important tips:  Avoid showering during the first 24 hours of wearing the monitor. Avoid excessive sweating to help maximize wear time. Do not submerge the device, no hot tubs, and no swimming pools. Keep any lotions or oils away from the patch. After 24 hours you may shower with the patch on. Take brief showers with your back facing the shower head.  Do not remove patch once it has been placed because that will interrupt data and decrease adhesive wear time. Push the button when you have any symptoms and write down what you were feeling. Once you have completed wearing your monitor, remove and place into box which has postage paid and place in your outgoing mailbox.  If for some reason you have misplaced your box then call our office and we can provide another box and/or mail it off for you.    Follow-Up: At [facility], you and your health needs are our priority.  As part of  our continuing mission to provide you with exceptional heart care, we have created designated Provider Care Teams.  These Care Teams include your primary Cardiologist (physician) and Advanced Practice Providers (APPs -  Physician Assistants and Nurse Practitioners) who all work together to provide you with the care you need, when you need it.  We recommend signing up for the patient portal called "MyChart".  Sign up information is provided on this After Visit Summary.  MyChart is used to connect with patients for Virtual Visits (Telemedicine).  Patients are able to view lab/test results, encounter notes, upcoming appointments, etc.  Non-urgent messages can be sent to your provider as well.   To learn more about what you can do with MyChart, go to [email or web address].    Your next appointment:   6 week(s)  The format for your next appointment:   In Person  Provider:   Debbe Odea, MD  ONLY   Other Instructions    Signed, Debbe Odea, MD  03/15/2021 1:09 PM    [facility]

## 2021-05-03 ENCOUNTER — Encounter: Payer: Self-pay | Admitting: Cardiology

## 2021-05-03 ENCOUNTER — Other Ambulatory Visit: Payer: Self-pay

## 2021-05-03 ENCOUNTER — Ambulatory Visit: Payer: Managed Care, Other (non HMO) | Admitting: Cardiology

## 2021-05-03 VITALS — BP 119/76 | HR 58 | Ht 71.0 in | Wt 137.2 lb

## 2021-05-03 DIAGNOSIS — R002 Palpitations: Secondary | ICD-10-CM

## 2021-05-03 DIAGNOSIS — Z7289 Other problems related to lifestyle: Secondary | ICD-10-CM | POA: Diagnosis not present

## 2021-05-03 NOTE — Patient Instructions (Signed)

## 2021-05-03 NOTE — Progress Notes (Addendum)
Cardiology Office Note:    Date:  05/03/2021   ID:  Ashlee Ramirez, DOB [birth year], MRN [id number]  PCP:  Mick Sell, MD   [facility] Providers Cardiologist:  Debbe Odea, MD     Referring MD: No ref. provider found   Chief Complaint  Patient presents with   Other    6 wk f/u no complaints today. Meds reviewed verbally with pt.     History of Present Illness:    Ashlee Ramirez is a 20 y.o. female with a hx of anxiety, engages in e-cigarettes use who presents for follow-up.  Previously seen due to palpitations.  Cardiac monitor was placed to evaluate any significant arrhythmias or inappropriate sinus tach.  Hydration was advised after previous orthostatic vital did suggest some level of orthostasis.  She now presents for cardiac monitor results and follow-up.  She still engages in vaping.  States symptoms have been about the same.  Has no concerns at this time.   Past Medical History:  Diagnosis Date   Allergy    Anxiety    Back pain    Depression     History reviewed. No pertinent surgical history.  Current Medications: Current Meds  Medication Sig   buPROPion (WELLBUTRIN XL) 150 MG 24 hr tablet Take by mouth daily.   levonorgestrel-ethinyl estradiol (ALESSE) 0.1-20 MG-MCG tablet Take 1 tablet by mouth every morning.     Allergies:   Aspirin   Social History   Socioeconomic History   Marital status: Single    Spouse name: Not on file   Number of children: Not on file   Years of education: Not on file   Highest education level: Not on file  Occupational History   Not on file  Tobacco Use   Smoking status: Former    Packs/day: 0.50    Types: Cigarettes   Smokeless tobacco: Never  Vaping Use   Vaping Use: Some days   Substances: Nicotine  Substance and Sexual Activity   Alcohol use: No   Drug use: No   Sexual activity: Yes    Birth control/protection: Pill, Condom  Other Topics Concern   Not on file  Social History Narrative    Not on file   Social Determinants of Health   Financial Resource Strain: Not on file  Food Insecurity: Not on file  Transportation Needs: Not on file  Physical Activity: Not on file  Stress: Not on file  Social Connections: Not on file     Family History: The patient's family history includes Anxiety disorder in her father; Atrial fibrillation in her mother; Drug abuse in her mother; Heart attack (age of onset: 17) in her paternal grandmother; Heart attack (age of onset: 73) in her maternal grandfather; Hyperlipidemia in her father; Pulmonary fibrosis in her paternal grandfather.  ROS:   Please see the history of present illness.     All other systems reviewed and are negative.  EKGs/Labs/Other Studies Reviewed:    The following studies were reviewed today:  HR BP SYMPTOMS  Lying 71  121/72 -  Sitting 90  101/63 dizzy  Standing ( ) 96 142/90 -  Standing ( ) 94 115/83 -     EKG:  EKG is  ordered today.  The ekg ordered today demonstrates normal sinus rhythm, normal ECG.  Recent Labs: 02/13/2021: BUN 8; Creatinine, Ser 0.76; Hemoglobin 13.0; Magnesium 2.1; Platelets 275; Potassium 4.3; Sodium 139; TSH 0.696  Recent Lipid Panel No results found for: CHOL, TRIG, HDL, CHOLHDL,  VLDL, LDLCALC, LDLDIRECT   Risk Assessment/Calculations:          Physical Exam:    VS:  BP 119/76 (BP Location: Left Arm, Patient Position: Sitting, Cuff Size: Normal)   Pulse (!) 58   Ht 5\' 11"  (1.803 m)   Wt 137 lb 4 oz (62.3 kg)   SpO2 98%   BMI 19.14 kg/m     Wt Readings from Last 3 Encounters:  05/03/21 137 lb 4 oz (62.3 kg)  03/15/21 139 lb (63 kg)  09/29/20 140 lb (63.5 kg)     GEN:  Well nourished, well developed in no acute distress HEENT: Normal NECK: No JVD; No carotid bruits LYMPHATICS: No lymphadenopathy CARDIAC: RRR, no murmurs, rubs, gallops RESPIRATORY:  Clear to auscultation without rales, wheezing or rhonchi  ABDOMEN: Soft, non-tender,  non-distended MUSCULOSKELETAL:  No edema; No deformity  SKIN: Warm and dry NEUROLOGIC:  Alert and oriented x 3 PSYCHIATRIC:  Normal affect   ASSESSMENT:    1. Palpitations   2. Engages in vaping     PLAN:    In order of problems listed above:  Palpitations, tachycardia with minimal exertion.  2-week cardiac monitor did not reveal any significant arrhythmias.  EKG showing sinus bradycardia, heart rate 58, Otherwise normal.  Will not start beta-blocker with low heart rates and patient's symptoms are not significant with no presyncope or syncope.  Continued hydration advised.   Engages in vaping, cessation again advised.   Follow-up as needed    Medication Adjustments/Labs and Tests Ordered: Current medicines are reviewed at length with the patient today.  Concerns regarding medicines are outlined above.  Orders Placed This Encounter  Procedures   EKG 12-Lead     No orders of the defined types were placed in this encounter.   Patient Instructions  Medication Instructions:  Your physician recommends that you continue on your current medications as directed. Please refer to the Current Medication list given to you today.  *If you need a refill on your cardiac medications before your next appointment, please call your pharmacy*   Lab Work: None ordered If you have labs (blood work) drawn today and your tests are completely normal, you will receive your results only by: MyChart Message (if you have MyChart) OR A paper copy in the mail If you have any lab test that is abnormal or we need to change your treatment, we will call you to review the results.   Testing/Procedures: None ordered   Follow-Up: At [facility], you and your health needs are our priority.  As part of our continuing mission to provide you with exceptional heart care, we have created designated Provider Care Teams.  These Care Teams include your primary Cardiologist (physician) and Advanced Practice  Providers (APPs -  Physician Assistants and Nurse Practitioners) who all work together to provide you with the care you need, when you need it.  We recommend signing up for the patient portal called "MyChart".  Sign up information is provided on this After Visit Summary.  MyChart is used to connect with patients for Virtual Visits (Telemedicine).  Patients are able to view lab/test results, encounter notes, upcoming appointments, etc.  Non-urgent messages can be sent to your provider as well.   To learn more about what you can do with MyChart, go to [facility].    Your next appointment:   Follow up as needed   The format for your next appointment:   In Person  Provider:   [email or web address],  MD   Other Instructions    Signed, Debbe Odea, MD  05/03/2021 10:12 AM    [facility]

## 2021-05-04 ENCOUNTER — Telehealth: Payer: Managed Care, Other (non HMO) | Admitting: Family Medicine

## 2021-05-04 DIAGNOSIS — J069 Acute upper respiratory infection, unspecified: Secondary | ICD-10-CM | POA: Diagnosis not present

## 2021-05-04 MED ORDER — BENZONATATE 100 MG PO CAPS: 100.0000 mg | ORAL_CAPSULE | Freq: Three times a day (TID) | ORAL | 0 refills | Status: DC | PRN

## 2021-05-04 MED ORDER — FLUTICASONE PROPIONATE 50 MCG/ACT NA SUSP: 2.0000 | Freq: Every day | NASAL | 6 refills | Status: DC

## 2021-05-04 NOTE — Progress Notes (Signed)

## 2021-06-08 ENCOUNTER — Other Ambulatory Visit (HOSPITAL_BASED_OUTPATIENT_CLINIC_OR_DEPARTMENT_OTHER): Payer: Self-pay | Admitting: Infectious Diseases

## 2021-06-08 ENCOUNTER — Other Ambulatory Visit (HOSPITAL_COMMUNITY): Payer: Self-pay | Admitting: Infectious Diseases

## 2021-06-08 DIAGNOSIS — G44019 Episodic cluster headache, not intractable: Secondary | ICD-10-CM

## 2021-06-12 ENCOUNTER — Ambulatory Visit: Payer: Managed Care, Other (non HMO)

## 2021-07-04 ENCOUNTER — Inpatient Hospital Stay: Admission: RE | Admit: 2021-07-04 | Payer: Managed Care, Other (non HMO) | Source: Ambulatory Visit

## 2021-07-16 ENCOUNTER — Telehealth: Payer: Managed Care, Other (non HMO) | Admitting: Physician Assistant

## 2021-07-16 DIAGNOSIS — J208 Acute bronchitis due to other specified organisms: Secondary | ICD-10-CM

## 2021-07-16 MED ORDER — PREDNISONE 10 MG (21) PO TBPK: ORAL_TABLET | ORAL | 0 refills | Status: DC

## 2021-07-16 MED ORDER — BENZONATATE 100 MG PO CAPS: 100.0000 mg | ORAL_CAPSULE | Freq: Three times a day (TID) | ORAL | 0 refills | Status: DC | PRN

## 2021-07-16 NOTE — Progress Notes (Signed)
We are sorry that you are not feeling well.  Here is how we plan to help! ° °Based on your presentation I believe you most likely have A cough due to a virus.  This is called viral bronchitis and is best treated by rest, plenty of fluids and control of the cough.  You may use Ibuprofen or Tylenol as directed to help your symptoms.   °  °In addition you may use A prescription cough medication called Tessalon Perles 100mg. You may take 1-2 capsules every 8 hours as needed for your cough. ° °Prednisone 10 mg daily for 6 days (see taper instructions below) ° °Directions for 6 day taper: °Day 1: 2 tablets before breakfast, 1 after both lunch & dinner and 2 at bedtime °Day 2: 1 tab before breakfast, 1 after both lunch & dinner and 2 at bedtime °Day 3: 1 tab at each meal & 1 at bedtime °Day 4: 1 tab at breakfast, 1 at lunch, 1 at bedtime °Day 5: 1 tab at breakfast & 1 tab at bedtime °Day 6: 1 tab at breakfast ° °From your responses in the eVisit questionnaire you describe inflammation in the upper respiratory tract which is causing a significant cough.  This is commonly called Bronchitis and has four common causes:   °Allergies °Viral Infections °Acid Reflux °Bacterial Infection °Allergies, viruses and acid reflux are treated by controlling symptoms or eliminating the cause. An example might be a cough caused by taking certain blood pressure medications. You stop the cough by changing the medication. Another example might be a cough caused by acid reflux. Controlling the reflux helps control the cough. ° °USE OF BRONCHODILATOR ("RESCUE") INHALERS: °There is a risk from using your bronchodilator too frequently.  The risk is that over-reliance on a medication which only relaxes the muscles surrounding the breathing tubes can reduce the effectiveness of medications prescribed to reduce swelling and congestion of the tubes themselves.  Although you feel brief relief from the bronchodilator inhaler, your asthma may actually be  worsening with the tubes becoming more swollen and filled with mucus.  This can delay other crucial treatments, such as oral steroid medications. If you need to use a bronchodilator inhaler daily, several times per day, you should discuss this with your provider.  There are probably better treatments that could be used to keep your asthma under control.  °   °HOME CARE °Only take medications as instructed by your medical team. °Complete the entire course of an antibiotic. °Drink plenty of fluids and get plenty of rest. °Avoid close contacts especially the very young and the elderly °Cover your mouth if you cough or cough into your sleeve. °Always remember to wash your hands °A steam or ultrasonic humidifier can help congestion.  ° °GET HELP RIGHT AWAY IF: °You develop worsening fever. °You become short of breath °You cough up blood. °Your symptoms persist after you have completed your treatment plan °MAKE SURE YOU  °Understand these instructions. °Will watch your condition. °Will get help right away if you are not doing well or get worse. °  ° °Thank you for choosing an e-visit. ° °Your e-visit answers were reviewed by a board certified advanced clinical practitioner to complete your personal care plan. Depending upon the condition, your plan could have included both over the counter or prescription medications. ° °Please review your pharmacy choice. Make sure the pharmacy is open so you can pick up prescription now. If there is a problem, you may contact your provider   through MyChart messaging and have the prescription routed to another pharmacy.  Your safety is important to us. If you have drug allergies check your prescription carefully.  ° °For the next 24 hours you can use MyChart to ask questions about today's visit, request a non-urgent call back, or ask for a work or school excuse. °You will get an email in the next two days asking about your experience. I hope that your e-visit has been valuable and will  speed your recovery. ° °I provided 5 minutes of non face-to-face time during this encounter for chart review and documentation.  ° °

## 2021-09-07 ENCOUNTER — Telehealth: Payer: Managed Care, Other (non HMO) | Admitting: Emergency Medicine

## 2021-09-07 DIAGNOSIS — J069 Acute upper respiratory infection, unspecified: Secondary | ICD-10-CM | POA: Diagnosis not present

## 2021-09-07 MED ORDER — FLUTICASONE PROPIONATE 50 MCG/ACT NA SUSP: 2.0000 | Freq: Every day | NASAL | 0 refills | Status: DC

## 2021-09-07 MED ORDER — BENZONATATE 100 MG PO CAPS: 100.0000 mg | ORAL_CAPSULE | Freq: Two times a day (BID) | ORAL | 0 refills | Status: DC | PRN

## 2021-09-07 NOTE — Progress Notes (Signed)

## 2021-10-24 NOTE — Progress Notes (Signed)
Cardiology Office Note:    Date:  10/25/2021   ID:  CIANNI MANNY, DOB 2000/09/17, MRN [id number]  PCP:  Mick Sell, MD  [facility] Cardiologist:  Debbe Odea, MD  [facility] Electrophysiologist:  None   Referring MD: Mick Sell, MD   Chief Complaint: tachycardia  History of Present Illness:    Ashlee Ramirez is a 21 y.o. female with a hx of anxiety, e-cigarrette use who presents for follow-up palpitations, tachycardia, and dizziness.   Prior heart monitor 04/2021 showed no significant arrhythmia or inappropriate sinus tachycardia.   Last seen 05/03/21 and had similar palpitations. Did have some orthostasis and good hydration was recommended.   Today, the patient reports persistent tachycardia and palpitations. Tuesday night, after work, standing and talking when she started feeling lightheaded and dizzy. BP 90/50, HR in the 50s. She was given IVF NS 500 bolus. BP improved, heart rate 52bpm. She was lightheaded for the next 2 days. Saw PCP, heart rate low and BP low. She reports persistent brief palpitations that occur 2-3 times a week, last a couple seconds. Seem to be worse with movement. Heart rate would race, highest Apple watch has recorded it 190bpm. Elevated heart rate along with dizziness keeps her from exercising.   She is eating and drinking normally. She has cut down on caffeine, 2-3 8oz sodas a day. Cut back on nicotine. Has not seen improvement. Feels anxiety it well controlled. Does no formal exercise. No recent fever or chills. No LLE, orthopnea, pnd.   Past Medical History:  Diagnosis Date   Allergy    Anxiety    Back pain    Depression     History reviewed. No pertinent surgical history.  Current Medications: Current Meds  Medication Sig   levonorgestrel-ethinyl estradiol (ALESSE) 0.1-20 MG-MCG tablet Take 1 tablet by mouth every morning.     Allergies:   Aspirin   Social History   Socioeconomic History   Marital status:  Single    Spouse name: Not on file   Number of children: Not on file   Years of education: Not on file   Highest education level: Not on file  Occupational History   Not on file  Tobacco Use   Smoking status: Former    Packs/day: 0.50    Types: Cigarettes   Smokeless tobacco: Never  Vaping Use   Vaping Use: Some days   Substances: Nicotine  Substance and Sexual Activity   Alcohol use: No   Drug use: No   Sexual activity: Yes    Birth control/protection: Pill, Condom  Other Topics Concern   Not on file  Social History Narrative   Not on file   Social Determinants of Health   Financial Resource Strain: Not on file  Food Insecurity: Not on file  Transportation Needs: Not on file  Physical Activity: Not on file  Stress: Not on file  Social Connections: Not on file     Family History: The patient's family history includes Anxiety disorder in her father; Atrial fibrillation in her mother; Drug abuse in her mother; Heart attack (age of onset: 92) in her paternal grandmother; Heart attack (age of onset: 68) in her maternal grandfather; Hyperlipidemia in her father; Pulmonary fibrosis in her paternal grandfather.  ROS:   Please see the history of present illness.     All other systems reviewed and are negative.  EKGs/Labs/Other Studies Reviewed:    The following studies were reviewed today:  Heart monitor 04/2021  Patch Wear Time:  13 days and 1 hours (2022-07-14T09:23:49-0400 to 2022-07-27T11:05:22-0400)   Patient had a min HR of 39 bpm, max HR of 195 bpm, and avg HR of 76 bpm. Predominant underlying rhythm was Sinus Rhythm. Isolated SVEs were rare (<1.0%), SVE Couplets were rare (<1.0%), and SVE Triplets were rare (<1.0%). Isolated VEs were rare (<1.0%),  VE Couplets were rare (<1.0%), and no VE Triplets were present.   EKG:  EKG is  ordered today.  The ekg ordered today demonstrates NSR 62bpm, minimal STE inferior leads possible early repol  Recent Labs: 02/13/2021: BUN  8; Creatinine, Ser 0.76; Hemoglobin 13.0; Magnesium 2.1; Platelets 275; Potassium 4.3; Sodium 139; TSH 0.696  Recent Lipid Panel No results found for: CHOL, TRIG, HDL, CHOLHDL, VLDL, LDLCALC, LDLDIRECT   Physical Exam:    VS:  BP 100/68 (BP Location: Left Arm, Patient Position: Sitting, Cuff Size: Normal)    Pulse 62    Ht 5\' 11"  (1.803 m)    Wt 136 lb 2 oz (61.7 kg)    SpO2 99%    BMI 18.99 kg/m     Wt Readings from Last 3 Encounters:  10/25/21 136 lb 2 oz (61.7 kg)  05/03/21 137 lb 4 oz (62.3 kg)  03/15/21 139 lb (63 kg)     GEN:  Well nourished, well developed in no acute distress HEENT: Normal NECK: No JVD; No carotid bruits LYMPHATICS: No lymphadenopathy CARDIAC: RRR, no murmurs, rubs, gallops RESPIRATORY:  Clear to auscultation without rales, wheezing or rhonchi  ABDOMEN: Soft, non-tender, non-distended MUSCULOSKELETAL:  No edema; No deformity  SKIN: Warm and dry NEUROLOGIC:  Alert and oriented x 3 PSYCHIATRIC:  Normal affect   ASSESSMENT:    1. Palpitations   2. Engages in vaping   3. Tachycardia    PLAN:    In order of problems listed above:  Pre-syncope episode Palpitations/tachycardia/dizziness Reports pre-syncope with dizziness and palpitations. Reported hypotension and bradycardia, BP improved with IVF. No chest pain or SOB. Has been persistently dizzy since episode. Reports history of tachycardia with rates to the 190s with minimal exertion. EKG today shows NSR with heart rate of 62bpm with suspected repolarization abnormality. Orthostatics negative, but patient reports severe dizziness and was unable to complete BP at 3 minutes. Heart rate elevated 64 to 91 upon sitting. PCP did CBC, BMET and TSH which were unremarkable I will check a Mag. Prior heart monitor showed predominately NSR with isolated SVEs. I will re-check 2 week heart monitor and obtain an echo. I will refer to EP for possible POTS work-up.   Vaping She has cut back, complete cessation  advised.  Disposition: Follow up in 4-6 week(s) with EP     Signed, Jefferey Lippmann 03/17/21, PA-C  10/25/2021 4:53 PM    [facility]

## 2021-10-25 ENCOUNTER — Ambulatory Visit: Payer: Self-pay

## 2021-10-25 ENCOUNTER — Encounter: Payer: Self-pay | Admitting: Medical

## 2021-10-25 ENCOUNTER — Other Ambulatory Visit: Payer: Self-pay

## 2021-10-25 ENCOUNTER — Ambulatory Visit: Payer: Managed Care, Other (non HMO) | Admitting: Medical

## 2021-10-25 VITALS — BP 100/68 | HR 62 | Ht 71.0 in | Wt 136.1 lb

## 2021-10-25 DIAGNOSIS — Z7289 Other problems related to lifestyle: Secondary | ICD-10-CM

## 2021-10-25 DIAGNOSIS — R000 Tachycardia, unspecified: Secondary | ICD-10-CM

## 2021-10-25 DIAGNOSIS — R002 Palpitations: Secondary | ICD-10-CM

## 2021-10-25 NOTE — Patient Instructions (Signed)
Medication Instructions:  No changes at this time.  *If you need a refill on your cardiac medications before your next appointment, please call your pharmacy*   Lab Work: Mag today  If you have labs (blood work) drawn today and your tests are completely normal, you will receive your results only by: MyChart Message (if you have MyChart) OR A paper copy in the mail If you have any lab test that is abnormal or we need to change your treatment, we will call you to review the results.   Testing/Procedures: Your physician has requested that you have an echocardiogram. Echocardiography is a painless test that uses sound waves to create images of your heart. It provides your doctor with information about the size and shape of your heart and how well your hearts chambers and valves are working. This procedure takes approximately one hour. There are no restrictions for this procedure.   Your provider has ordered a heart monitor to wear for 14 days. This will be mailed to your home with instructions on placement. Once you have finished the time frame requested, you will return monitor in box provided.      Follow-Up: At [facility], you and your health needs are our priority.  As part of our continuing mission to provide you with exceptional heart care, we have created designated Provider Care Teams.  These Care Teams include your primary Cardiologist (physician) and Advanced Practice Providers (APPs -  Physician Assistants and Nurse Practitioners) who all work together to provide you with the care you need, when you need it.   Your next appointment:   3 month(s)  The format for your next appointment:   In Person  Provider:   Debbe Odea, MD or Cadence Lorna Few    Other Instructions Referral placed for Electrophysiologist (EP) to be seen here in our office.

## 2021-10-26 LAB — MAGNESIUM: Magnesium: 2.2 mg/dL (ref 1.6–2.3)

## 2021-10-31 ENCOUNTER — Telehealth: Payer: Managed Care, Other (non HMO) | Admitting: Physician Assistant

## 2021-10-31 DIAGNOSIS — B9789 Other viral agents as the cause of diseases classified elsewhere: Secondary | ICD-10-CM

## 2021-10-31 DIAGNOSIS — J218 Acute bronchiolitis due to other specified organisms: Secondary | ICD-10-CM

## 2021-10-31 MED ORDER — BENZONATATE 100 MG PO CAPS: 100.0000 mg | ORAL_CAPSULE | Freq: Two times a day (BID) | ORAL | 0 refills | Status: AC | PRN

## 2021-10-31 MED ORDER — PREDNISONE 10 MG (21) PO TBPK: ORAL_TABLET | ORAL | 0 refills | Status: AC

## 2021-10-31 NOTE — Progress Notes (Signed)
We are sorry that you are not feeling well.  Here is how we plan to help! ? ?Based on your presentation I believe you most likely have A cough due to a virus.  This is called viral bronchitis and is best treated by rest, plenty of fluids and control of the cough.  You may use Ibuprofen or Tylenol as directed to help your symptoms.   ?  ?In addition you may use A non-prescription cough medication called Mucinex DM: take 2 tablets every 12 hours. and A prescription cough medication called Tessalon Perles 100mg. You may take 1-2 capsules every 8 hours as needed for your cough. ? ?Prednisone 10 mg daily for 6 days (see taper instructions below) ? ?Directions for 6 day taper: ?Day 1: 2 tablets before breakfast, 1 after both lunch & dinner and 2 at bedtime ?Day 2: 1 tab before breakfast, 1 after both lunch & dinner and 2 at bedtime ?Day 3: 1 tab at each meal & 1 at bedtime ?Day 4: 1 tab at breakfast, 1 at lunch, 1 at bedtime ?Day 5: 1 tab at breakfast & 1 tab at bedtime ?Day 6: 1 tab at breakfast ? ?From your responses in the eVisit questionnaire you describe inflammation in the upper respiratory tract which is causing a significant cough.  This is commonly called Bronchitis and has four common causes:   ?Allergies ?Viral Infections ?Acid Reflux ?Bacterial Infection ?Allergies, viruses and acid reflux are treated by controlling symptoms or eliminating the cause. An example might be a cough caused by taking certain blood pressure medications. You stop the cough by changing the medication. Another example might be a cough caused by acid reflux. Controlling the reflux helps control the cough. ? ?USE OF BRONCHODILATOR ("RESCUE") INHALERS: ?There is a risk from using your bronchodilator too frequently.  The risk is that over-reliance on a medication which only relaxes the muscles surrounding the breathing tubes can reduce the effectiveness of medications prescribed to reduce swelling and congestion of the tubes themselves.   Although you feel brief relief from the bronchodilator inhaler, your asthma may actually be worsening with the tubes becoming more swollen and filled with mucus.  This can delay other crucial treatments, such as oral steroid medications. If you need to use a bronchodilator inhaler daily, several times per day, you should discuss this with your provider.  There are probably better treatments that could be used to keep your asthma under control.  ?   ?HOME CARE ?Only take medications as instructed by your medical team. ?Complete the entire course of an antibiotic. ?Drink plenty of fluids and get plenty of rest. ?Avoid close contacts especially the very young and the elderly ?Cover your mouth if you cough or cough into your sleeve. ?Always remember to wash your hands ?A steam or ultrasonic humidifier can help congestion.  ? ?GET HELP RIGHT AWAY IF: ?You develop worsening fever. ?You become short of breath ?You cough up blood. ?Your symptoms persist after you have completed your treatment plan ?MAKE SURE YOU  ?Understand these instructions. ?Will watch your condition. ?Will get help right away if you are not doing well or get worse. ?  ? ?Thank you for choosing an e-visit. ? ?Your e-visit answers were reviewed by a board certified advanced clinical practitioner to complete your personal care plan. Depending upon the condition, your plan could have included both over the counter or prescription medications. ? ?Please review your pharmacy choice. Make sure the pharmacy is open so you can   pick up prescription now. If there is a problem, you may contact your provider through MyChart messaging and have the prescription routed to another pharmacy.  Your safety is important to us. If you have drug allergies check your prescription carefully.  ? ?For the next 24 hours you can use MyChart to ask questions about today's visit, request a non-urgent call back, or ask for a work or school excuse. ?You will get an email in the next  two days asking about your experience. I hope that your e-visit has been valuable and will speed your recovery. ? ?I provided 5 minutes of non face-to-face time during this encounter for chart review and documentation.  ? ?

## 2021-11-21 ENCOUNTER — Other Ambulatory Visit: Payer: Managed Care, Other (non HMO)

## 2021-12-11 ENCOUNTER — Encounter: Payer: Self-pay | Admitting: Medical

## 2021-12-11 NOTE — Progress Notes (Signed)
Unable to reach patient to schedule echocardiogram, sent letter, order cancelled.

## 2021-12-20 ENCOUNTER — Institutional Professional Consult (permissible substitution): Payer: Managed Care, Other (non HMO) | Admitting: Internal Medicine

## 2021-12-21 ENCOUNTER — Encounter: Payer: Self-pay | Admitting: Internal Medicine

## 2021-12-25 ENCOUNTER — Encounter: Payer: Managed Care, Other (non HMO) | Admitting: Obstetrics and Gynecology

## 2021-12-25 DIAGNOSIS — Z3200 Encounter for pregnancy test, result unknown: Secondary | ICD-10-CM

## 2021-12-27 ENCOUNTER — Other Ambulatory Visit (INDEPENDENT_AMBULATORY_CARE_PROVIDER_SITE_OTHER): Payer: Managed Care, Other (non HMO)

## 2021-12-27 ENCOUNTER — Ambulatory Visit (INDEPENDENT_AMBULATORY_CARE_PROVIDER_SITE_OTHER): Payer: Managed Care, Other (non HMO) | Admitting: Obstetrics

## 2021-12-27 ENCOUNTER — Encounter: Payer: Self-pay | Admitting: Obstetrics

## 2021-12-27 VITALS — BP 116/76 | HR 101 | Ht 71.0 in | Wt 136.5 lb

## 2021-12-27 DIAGNOSIS — Z3200 Encounter for pregnancy test, result unknown: Secondary | ICD-10-CM

## 2021-12-27 DIAGNOSIS — N912 Amenorrhea, unspecified: Secondary | ICD-10-CM | POA: Diagnosis not present

## 2021-12-27 LAB — POCT URINE PREGNANCY: Preg Test, Ur: POSITIVE — AB

## 2021-12-27 NOTE — Progress Notes (Signed)
SUBJECTIVE ? ?Ashlee Ramirez is a 21 y.o. female who presents for evaluation of amenorrhea and possible pregnancy. Her LMP was Patient's last menstrual period was 11/12/2021 (exact date).  Normal. She reports regular periods every month. ?Pregnancy is desired. ?Current symptoms include breast tenderness, fatigue, nausea, and positive home pregnancy test ? ?Review of Systems ?Pertinent items are noted in HPI. ? ?OBJECTIVE ?BP 116/76   Pulse (!) 101   Ht 5\' 11"  (1.803 m)   Wt 136 lb 8 oz (61.9 kg)   LMP 11/12/2021 (Exact Date)   BMI 19.04 kg/m?  ?Body mass index is 19.04 kg/m?. ? ?Alert, well appearing, and in no distress, oriented to person, place, and time, and normal appearing weight ? ?Lab Review ?Urine hCG: positive ? ?ASSESSMENT ?Amenorrhea. Presumed pregnancy at [redacted]w[redacted]d with EDD of Estimated Date of Delivery: 08/19/22. ? ?PLAN  ?Encouraged a well-balanced diet, rest, hydration, prenatal vitamins, and walking for exercise. Counseled to avoid alcohol, tobacco, and recreational drugs and to minimize caffeine intake. Safe medications list given. Discussed non-pharmacologic relief measures for nausea. Reviewed midwifery or physician care for prenatal care and birth.  Genetic screening options were discussed.  A dating 08/21/22 was offered and ordered. Return in 2-3 weeks for a nurse visit for labs and prenatal education, and 6-7 weeks for a NOB physical and genetic screening if desired.  ? ?[place], CNM  ?

## 2022-01-07 ENCOUNTER — Ambulatory Visit (INDEPENDENT_AMBULATORY_CARE_PROVIDER_SITE_OTHER): Payer: Managed Care, Other (non HMO) | Admitting: Obstetrics

## 2022-01-07 VITALS — BP 101/67 | HR 116 | Wt 136.5 lb

## 2022-01-07 DIAGNOSIS — Z3401 Encounter for supervision of normal first pregnancy, first trimester: Secondary | ICD-10-CM

## 2022-01-07 DIAGNOSIS — Z113 Encounter for screening for infections with a predominantly sexual mode of transmission: Secondary | ICD-10-CM | POA: Diagnosis not present

## 2022-01-07 DIAGNOSIS — Z0283 Encounter for blood-alcohol and blood-drug test: Secondary | ICD-10-CM

## 2022-01-07 DIAGNOSIS — Z3A01 Less than 8 weeks gestation of pregnancy: Secondary | ICD-10-CM | POA: Diagnosis not present

## 2022-01-07 NOTE — Progress Notes (Signed)
Ashlee Ramirez presents for NOB nurse interview visit. Pregnancy confirmation done 12/27/21.  G1. Pregnancy education material explained and given. NOB labs ordered. Body mass index is 19.04 kg/m?Marland Kitchen HIV labs and Drug screen were explained optional and she did not decline. Drug screen ordered. PNV encouraged. Genetic screening options discussed. Genetic testing: Unsure.  Pt to discuss with provider.  Financial policy reviewed. FMLA from reviewed and signed. Pt. To follow up with provider in 4 weeks for NOB physical. No concerns voiced by patient. All questions answered. ?  ?

## 2022-01-08 ENCOUNTER — Telehealth: Payer: Self-pay | Admitting: Certified Nurse Midwife

## 2022-01-08 ENCOUNTER — Encounter: Payer: Self-pay | Admitting: Certified Nurse Midwife

## 2022-01-08 LAB — MONITOR DRUG PROFILE 14(MW)
Amphetamine Scrn, Ur: NEGATIVE ng/mL
BARBITURATE SCREEN URINE: NEGATIVE ng/mL
BENZODIAZEPINE SCREEN, URINE: NEGATIVE ng/mL
Buprenorphine, Urine: NEGATIVE ng/mL
CANNABINOIDS UR QL SCN: NEGATIVE ng/mL
Cocaine (Metab) Scrn, Ur: NEGATIVE ng/mL
Creatinine(Crt), U: 52.9 mg/dL (ref 20.0–300.0)
Fentanyl, Urine: NEGATIVE pg/mL
Meperidine Screen, Urine: NEGATIVE ng/mL
Methadone Screen, Urine: NEGATIVE ng/mL
OXYCODONE+OXYMORPHONE UR QL SCN: NEGATIVE ng/mL
Opiate Scrn, Ur: NEGATIVE ng/mL
Ph of Urine: 6 (ref 4.5–8.9)
Phencyclidine Qn, Ur: NEGATIVE ng/mL
Propoxyphene Scrn, Ur: NEGATIVE ng/mL
SPECIFIC GRAVITY: 1.008
Tramadol Screen, Urine: NEGATIVE ng/mL

## 2022-01-08 LAB — URINALYSIS, ROUTINE W REFLEX MICROSCOPIC
Bilirubin, UA: NEGATIVE
Glucose, UA: NEGATIVE
Ketones, UA: NEGATIVE
Leukocytes,UA: NEGATIVE
Nitrite, UA: NEGATIVE
Protein,UA: NEGATIVE
RBC, UA: NEGATIVE
Specific Gravity, UA: 1.008 (ref 1.005–1.030)
Urobilinogen, Ur: 0.2 mg/dL (ref 0.2–1.0)
pH, UA: 6.5 (ref 5.0–7.5)

## 2022-01-08 LAB — NICOTINE SCREEN, URINE: Cotinine Ql Scrn, Ur: POSITIVE ng/mL — AB

## 2022-01-08 NOTE — Telephone Encounter (Signed)
Patient states that she is cramping in her lower abdomen really bad, Pt denies any bleeding or spotting. Patient rated her pain level as an 8. Please advise.  ?

## 2022-01-09 ENCOUNTER — Other Ambulatory Visit: Payer: Self-pay | Admitting: Certified Nurse Midwife

## 2022-01-09 DIAGNOSIS — Z3A08 8 weeks gestation of pregnancy: Secondary | ICD-10-CM

## 2022-01-09 LAB — VIRAL HEPATITIS HBV, HCV
HCV Ab: NONREACTIVE
Hep B Core Total Ab: NEGATIVE
Hep B Surface Ab, Qual: NONREACTIVE
Hepatitis B Surface Ag: NEGATIVE

## 2022-01-09 LAB — ABO AND RH: Rh Factor: POSITIVE

## 2022-01-09 LAB — HCV INTERPRETATION

## 2022-01-09 LAB — ANTIBODY SCREEN: Antibody Screen: NEGATIVE

## 2022-01-09 LAB — RUBELLA SCREEN: Rubella Antibodies, IGG: 3.06 index (ref >0.99)

## 2022-01-09 LAB — HGB SOLU + RFLX FRAC: Sickle Solubility Test - HGBRFX: NEGATIVE

## 2022-01-09 LAB — HIV ANTIBODY (ROUTINE TESTING W REFLEX): HIV Screen 4th Generation wRfx: NONREACTIVE

## 2022-01-09 LAB — TOXOPLASMA ANTIBODIES- IGG AND  IGM
Toxoplasma Antibody- IgM: 10.1 AU/mL — ABNORMAL HIGH (ref 0.0–7.9)
Toxoplasma IgG Ratio: <3 IU/mL (ref 0.0–7.1)

## 2022-01-09 LAB — GC/CHLAMYDIA PROBE AMP
Chlamydia trachomatis, NAA: NEGATIVE
Neisseria Gonorrhoeae by PCR: NEGATIVE

## 2022-01-09 LAB — RPR: RPR Ser Ql: NONREACTIVE

## 2022-01-09 LAB — VARICELLA ZOSTER ANTIBODY, IGG: Varicella zoster IgG: 147 index — ABNORMAL LOW (ref >165)

## 2022-01-10 ENCOUNTER — Encounter: Payer: Self-pay | Admitting: Certified Nurse Midwife

## 2022-01-10 LAB — CULTURE, OB URINE

## 2022-01-10 LAB — URINE CULTURE, OB REFLEX

## 2022-01-11 ENCOUNTER — Telehealth: Payer: Self-pay | Admitting: Obstetrics

## 2022-01-11 ENCOUNTER — Encounter: Payer: Self-pay | Admitting: Obstetrics

## 2022-01-11 NOTE — Telephone Encounter (Signed)
Pt called and complains of spotting. Pt states spotting started this morning around 8am. Denies any cramping or pain. States it is light and dark brown in color with very small clots present. Patient denies any other symptoms. Please advise.  ?

## 2022-01-12 ENCOUNTER — Emergency Department: Payer: Managed Care, Other (non HMO)

## 2022-01-12 ENCOUNTER — Emergency Department
Admission: EM | Admit: 2022-01-12 | Discharge: 2022-01-12 | Disposition: A | Discharge status: Home / Self Care | Payer: Managed Care, Other (non HMO) | Attending: Emergency Medicine | Admitting: Emergency Medicine

## 2022-01-12 ENCOUNTER — Encounter: Payer: Self-pay | Admitting: Emergency Medicine

## 2022-01-12 ENCOUNTER — Other Ambulatory Visit: Payer: Self-pay

## 2022-01-12 DIAGNOSIS — D72829 Elevated white blood cell count, unspecified: Secondary | ICD-10-CM | POA: Diagnosis not present

## 2022-01-12 DIAGNOSIS — Z3A08 8 weeks gestation of pregnancy: Secondary | ICD-10-CM | POA: Insufficient documentation

## 2022-01-12 DIAGNOSIS — O200 Threatened abortion: Secondary | ICD-10-CM | POA: Diagnosis not present

## 2022-01-12 DIAGNOSIS — O209 Hemorrhage in early pregnancy, unspecified: Secondary | ICD-10-CM | POA: Diagnosis present

## 2022-01-12 DIAGNOSIS — R1030 Lower abdominal pain, unspecified: Secondary | ICD-10-CM | POA: Insufficient documentation

## 2022-01-12 LAB — COMPREHENSIVE METABOLIC PANEL
ALT: 14 U/L (ref 0–44)
AST: 17 U/L (ref 15–41)
Albumin: 4.4 g/dL (ref 3.5–5.0)
Alkaline Phosphatase: 62 U/L (ref 38–126)
Anion gap: 7 (ref 5–15)
BUN: 6 mg/dL (ref 6–20)
CO2: 24 mmol/L (ref 22–32)
Calcium: 9.4 mg/dL (ref 8.9–10.3)
Chloride: 106 mmol/L (ref 98–111)
Creatinine, Ser: 0.75 mg/dL (ref 0.44–1.00)
GFR, Estimated: >60 mL/min (ref >60)
Glucose, Bld: 97 mg/dL (ref 70–99)
Potassium: 3.7 mmol/L (ref 3.5–5.1)
Sodium: 137 mmol/L (ref 135–145)
Total Bilirubin: 0.6 mg/dL (ref 0.3–1.2)
Total Protein: 7.7 g/dL (ref 6.5–8.1)

## 2022-01-12 LAB — CBC
HCT: 37 % (ref 36.0–46.0)
Hemoglobin: 12.1 g/dL (ref 12.0–15.0)
MCH: 29.2 pg (ref 26.0–34.0)
MCHC: 32.7 g/dL (ref 30.0–36.0)
MCV: 89.2 fL (ref 80.0–100.0)
Platelets: 268 10*3/uL (ref 150–400)
RBC: 4.15 MIL/uL (ref 3.87–5.11)
RDW: 12.5 % (ref 11.5–15.5)
WBC: 6.7 10*3/uL (ref 4.0–10.5)
nRBC: 0 % (ref 0.0–0.2)

## 2022-01-12 LAB — HCG, QUANTITATIVE, PREGNANCY: hCG, Beta Chain, Quant, S: 1830 m[IU]/mL — ABNORMAL HIGH (ref <5)

## 2022-01-12 NOTE — ED Notes (Signed)
See triage note. Pt ambulatory to ED room and in NAD.  ?

## 2022-01-12 NOTE — ED Triage Notes (Signed)
Pt reports is approx [redacted] weeks pregnant and thinks she is having a miscarriage. Pt states yesterday started with light bleeding, called her OB and they advised her to just watch it. Pt reports today the bleeding has gotten a little heavier and she has had small clots, smaller than a quarter. Pt reports no pain at all now in her abd. Pt reports this is the first pregnancy.  ?

## 2022-01-12 NOTE — ED Provider Notes (Signed)
? ?[facility] ?Provider Note ? ? ? Event Date/Time  ? First MD Initiated Contact with Patient 01/12/22 [phone]   ?  (approximate) ? ? ?History  ? ?Chief Complaint ?Vaginal Bleeding ? ? ?HPI ? ?Ashlee Ramirez is a 21 y.o. female, G1P0 at approximately 8 weeks of pregnancy, who presents to the ED complaining of vaginal bleeding.  Patient reports that yesterday she developed light vaginal bleeding that has gradually worsened since then.  She states that she is passing clots about the size of a $0.50 piece and having to change pads every 4-5 hours.  She currently denies any associated pain, did have some lower abdominal cramping 4 days ago that she was seen in the [facility] ED for.  She had an ultrasound at that time showing intrauterine gestational sac but no associated yolk sac. Pain has since improved prior to onset of bleeding.  She has been following with [facility] for her obstetric care. ?  ? ? ?Physical Exam  ? ?Triage Vital Signs: ?ED Triage Vitals  ?Enc Vitals Group  ?   BP 01/12/22 0733 118/62  ?   Pulse Rate 01/12/22 0733 93  ?   Resp 01/12/22 0733 18  ?   Temp 01/12/22 0733 98.1 ?F (36.7 ?C)  ?   Temp src --   ?   SpO2 01/12/22 0733 100 %  ?   Weight 01/12/22 0734 136 lb (61.7 kg)  ?   Height 01/12/22 0734 5\' 11"  (1.803 m)  ?   Head Circumference --   ?   Peak Flow --   ?   Pain Score 01/12/22 0734 0  ?   Pain Loc --   ?   Pain Edu? --   ?   Excl. in GC? --   ? ? ?Most recent vital signs: ?Vitals:  ? 01/12/22 0733 01/12/22 0830  ?BP: 118/62 104/66  ?Pulse: 93 81  ?Resp: 18 16  ?Temp: 98.1 ?F (36.7 ?C)   ?SpO2: 100% 100%  ? ? ?Constitutional: Alert and oriented. ?Eyes: Conjunctivae are normal. ?Head: Atraumatic. ?Nose: No congestion/rhinnorhea. ?Mouth/Throat: Mucous membranes are moist.  ?Cardiovascular: Normal rate, regular rhythm. Grossly normal heart sounds.  2+ radial pulses bilaterally. ?Respiratory: Normal respiratory effort.  No retractions. Lungs CTAB. ?Gastrointestinal: Soft and  nontender. No distention. ?Musculoskeletal: No lower extremity tenderness nor edema.  ?Neurologic:  Normal speech and language. No gross focal neurologic deficits are appreciated. ? ? ? ?ED Results / Procedures / Treatments  ? ?Labs ?(all labs ordered are listed, but only abnormal results are displayed) ?Labs Reviewed  ?HCG, QUANTITATIVE, PREGNANCY - Abnormal; Notable for the following components:  ?    Result Value  ? hCG, Beta Chain, Quant, S 1,830 (*)   ? All other components within normal limits  ?COMPREHENSIVE METABOLIC PANEL  ?CBC  ? ?RADIOLOGY ?Obstetric ultrasound reviewed and interpreted by me with intrauterine gestational sac as well as yolk sac confirming intrauterine pregnancy. ? ?PROCEDURES: ? ?Critical Care performed: No ? ?Procedures ? ? ?MEDICATIONS ORDERED IN ED: ?Medications - No data to display ? ? ?IMPRESSION / MDM / ASSESSMENT AND PLAN / ED COURSE  ?I reviewed the triage vital signs and the nursing notes. ?             ?               ? ?21 y.o. female, G1P0 at approximately 8 weeks of pregnancy, presents to the ED complaining of gradually worsening vaginal  bleeding since yesterday afternoon without associated pain ? ?Differential diagnosis includes, but is not limited to, ectopic pregnancy, spontaneous abortion, threatened abortion, anemia. ? ?Patient well-appearing and in no acute distress, she has a benign abdominal exam.  Patient declines pelvic examination, states that she had one performed at [facility] and would like to hold off on repeating this for now.  Ultrasound from her [facility] visit was reviewed by me and shows intrauterine gestational sac without yolk sac which may be related to early pregnancy but also does not exclude ectopic or heterotopic pregnancy.  We will repeat ultrasound today, beta-hCG levels are pending.  Initial labs are reassuring with CBC showing leukocytosis, BMP without electrolyte abnormality or AKI, LFTs within normal limits. ? ?Center of ultrasound is reassuring with  intrauterine gestational and yolk sacs confirming intrauterine pregnancy.  However, beta hCG levels are downtrending from her recent ED visit at [facility], concerning for emergency and impending miscarriage.  Given minimal bleeding or pain at this time, patient is appropriate for outpatient follow-up with OB/GYN, was counseled to return to the ED for new or worsening symptoms.  Patient agrees with plan. ? ?  ? ? ?FINAL CLINICAL IMPRESSION(S) / ED DIAGNOSES  ? ?Final diagnoses:  ?Threatened miscarriage  ? ? ? ?Rx / DC Orders  ? ?ED Discharge Orders   ? ? None  ? ?  ? ? ? ?Note:  This document was prepared using Dragon voice recognition software and may include unintentional dictation errors. ?  ?Chesley Noon, MD ?01/12/22 1018 ? ?

## 2022-01-12 NOTE — ED Notes (Signed)
EDP at bedside talking to pt. Pt declines pelvic exam at this time. Pt believes she is around [redacted] weeks pregnant and denies pelvic or abdominal pain but endorses red bleeding with small clots (quarter size) since yesterday. Was evaluated at [facility] recently for same, along with abdominal/pelvic cramping. Pt denies any needs at this time. Provided warm blanket. Friend at bedside. ?

## 2022-01-14 ENCOUNTER — Telehealth: Payer: Self-pay

## 2022-01-14 NOTE — Telephone Encounter (Signed)
Patient had a follow up appointment scheduled next week on 01/21/22 to review Echo and Zio results. Patient no showed for her Echo and her Elwyn Reach is marked lost on the [employer]. ? ?Called patient and left a detailed VM per DPR on file informing her that I am cancelling her scheduled follow up and requested a call back to get her rescheduled for the Echo and discuss having another Zio monitor sent to her. ? ?Will await to hear back from patient. ?

## 2022-01-15 ENCOUNTER — Other Ambulatory Visit: Payer: Managed Care, Other (non HMO)

## 2022-01-16 ENCOUNTER — Other Ambulatory Visit: Payer: Managed Care, Other (non HMO)

## 2022-01-16 ENCOUNTER — Other Ambulatory Visit: Payer: Self-pay | Admitting: Certified Nurse Midwife

## 2022-01-16 DIAGNOSIS — O039 Complete or unspecified spontaneous abortion without complication: Secondary | ICD-10-CM

## 2022-01-21 ENCOUNTER — Ambulatory Visit: Payer: Managed Care, Other (non HMO) | Admitting: Cardiology

## 2022-01-22 ENCOUNTER — Other Ambulatory Visit: Payer: Managed Care, Other (non HMO)

## 2022-02-05 ENCOUNTER — Encounter: Payer: Managed Care, Other (non HMO) | Admitting: Certified Nurse Midwife

## 2022-02-26 ENCOUNTER — Encounter: Payer: Managed Care, Other (non HMO) | Admitting: Obstetrics and Gynecology

## 2023-04-29 IMAGING — CR DG CHEST 2V
2 series · 2 of 2 positions shown · non-contrast
Comparison: 09/11/2016

CLINICAL DATA: Chest pain

EXAM:
CHEST - 2 VIEW

[chest pa]
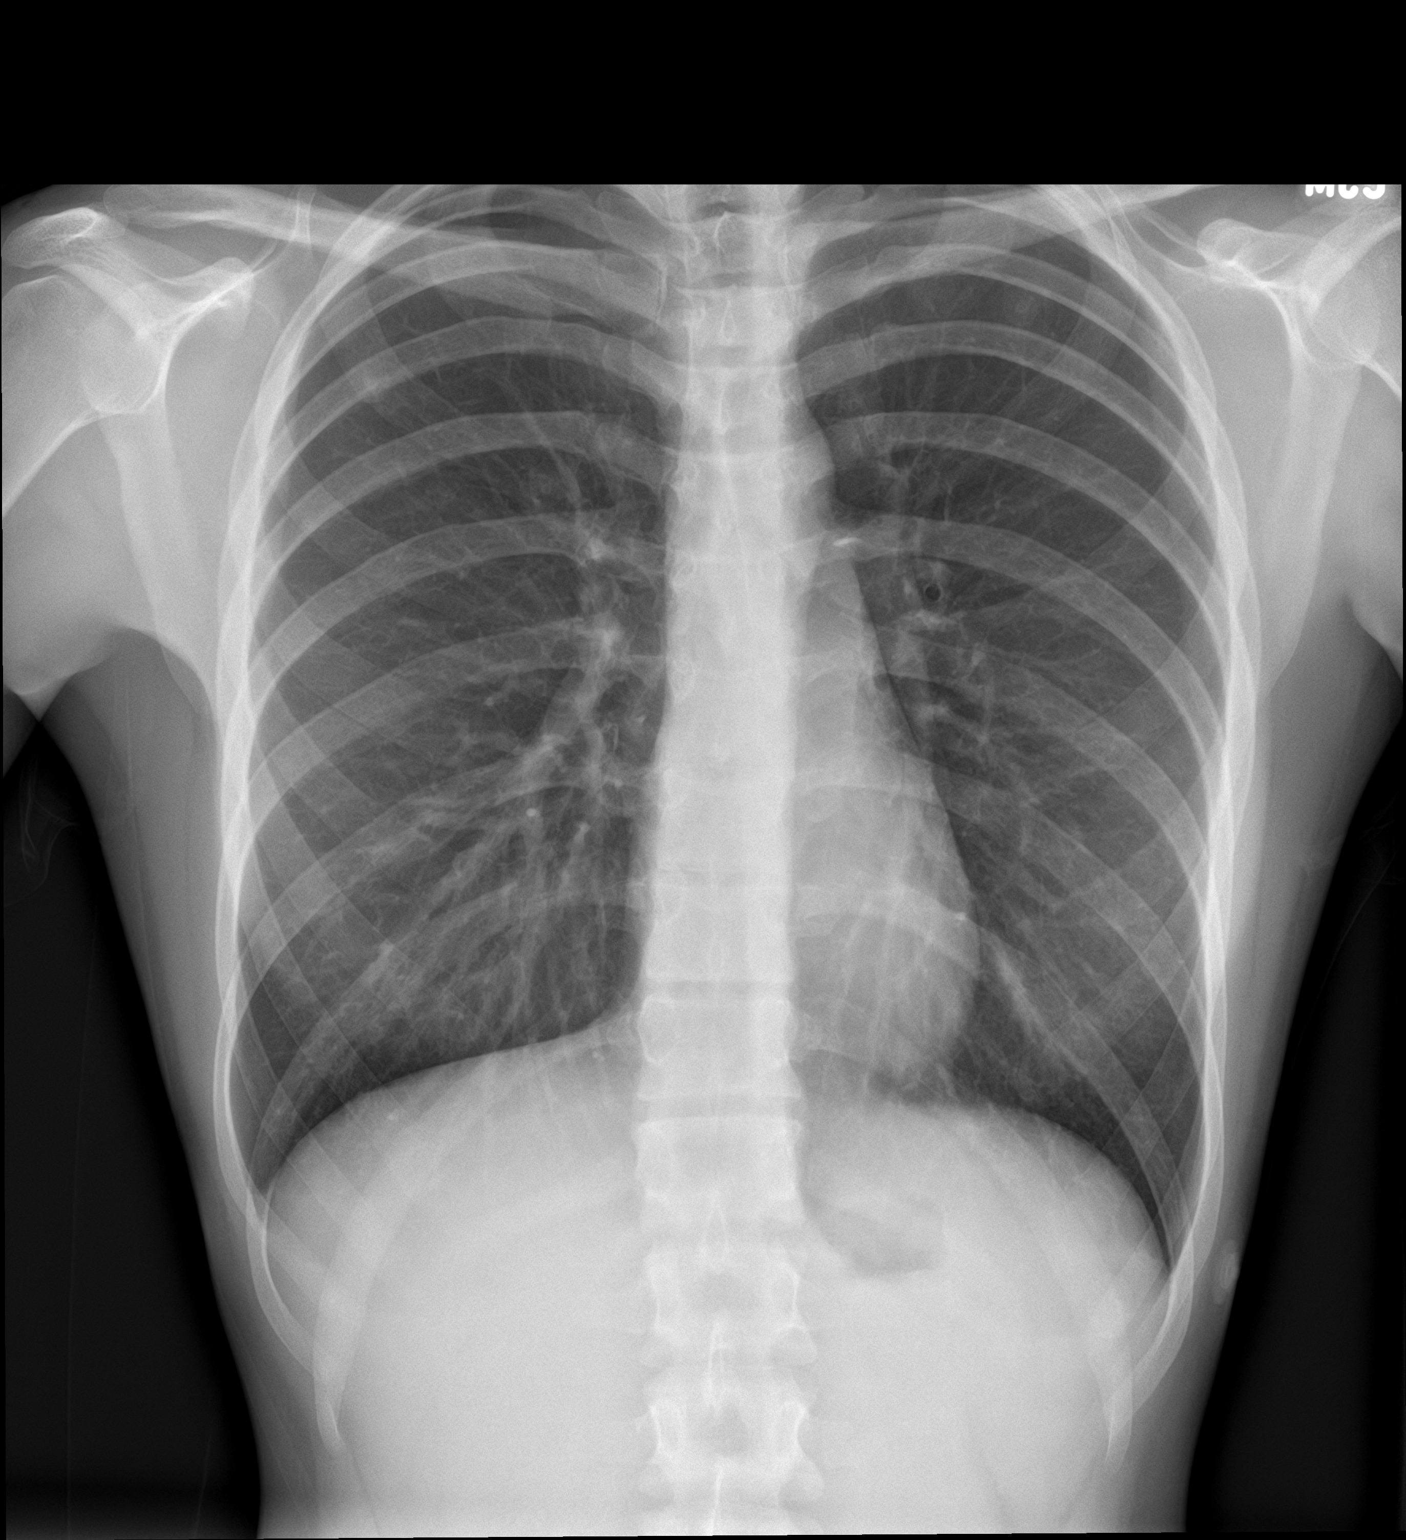

[chest lat]
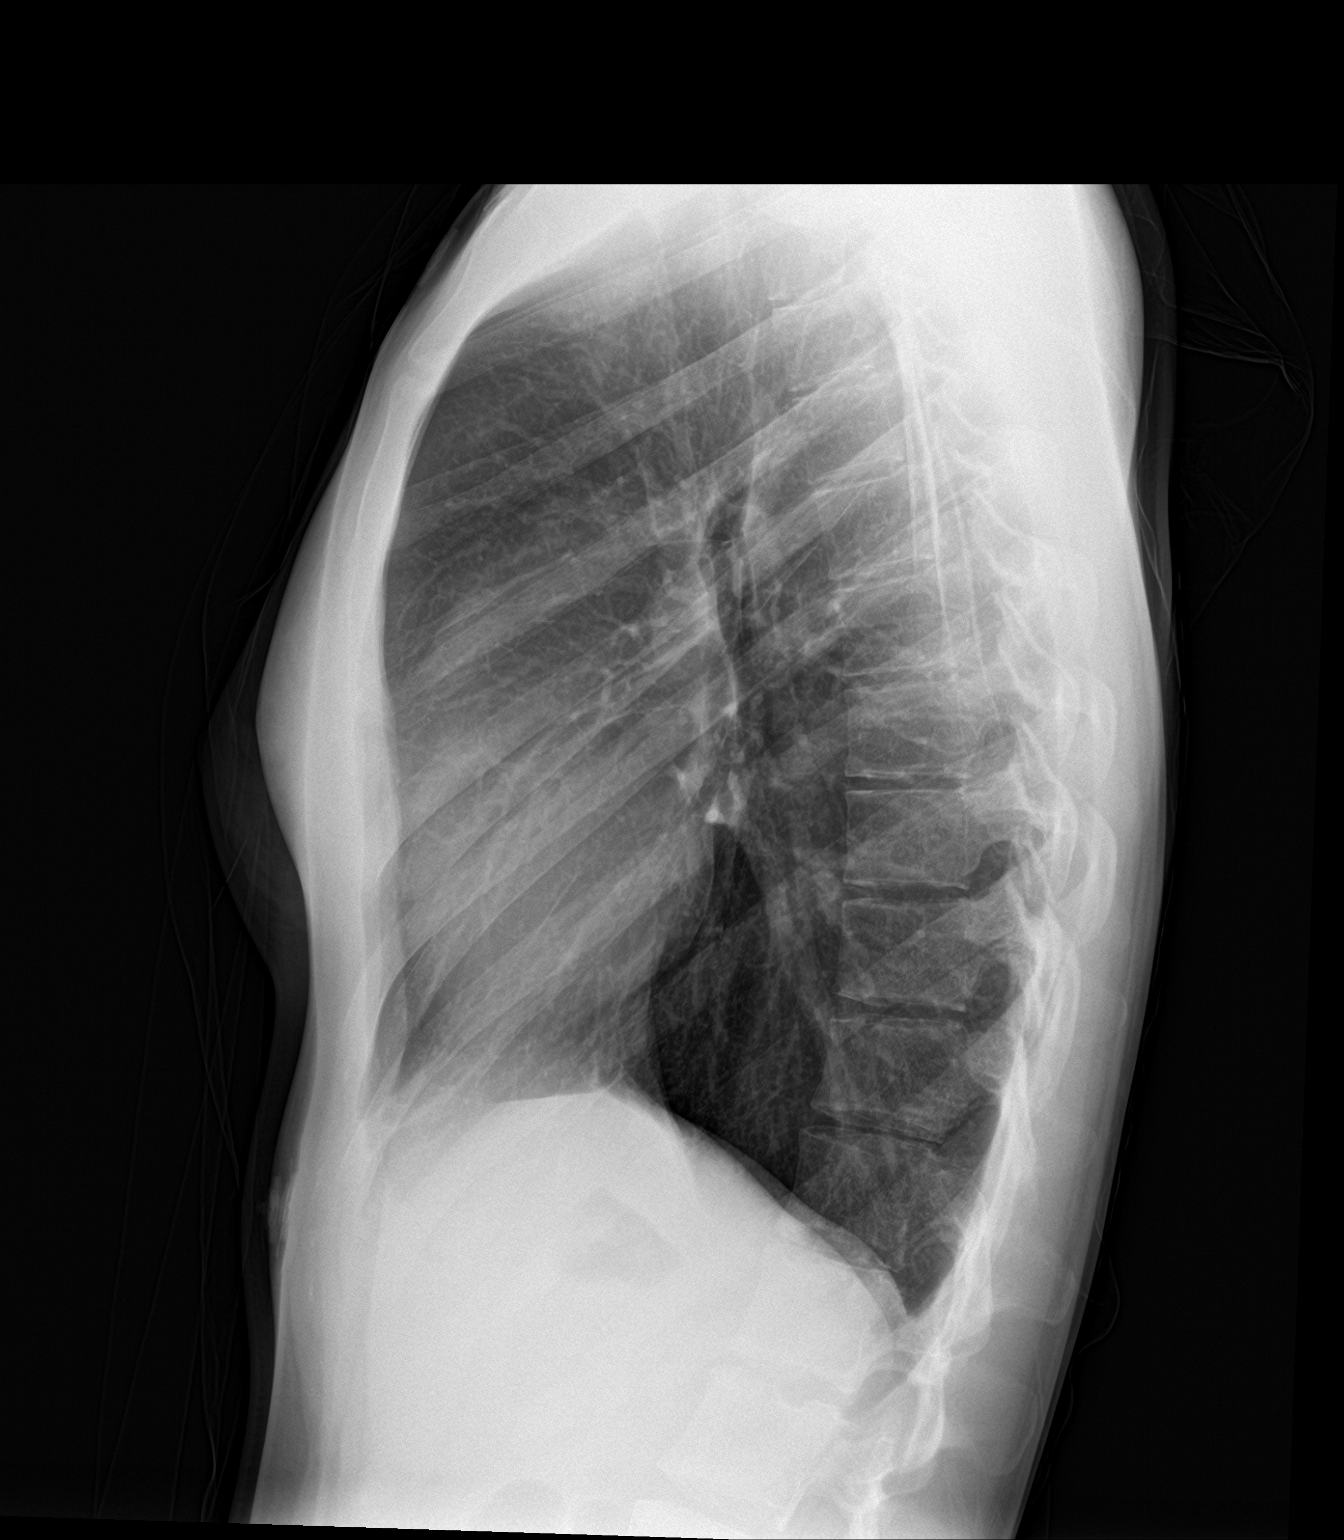

[2 of 2 positions shown; findings below may reference images not displayed]

FINDINGS: The heart size and mediastinal contours are within normal limits.
Both lungs are clear. The visualized skeletal structures are
unremarkable.
IMPRESSION: No active cardiopulmonary disease.

## 2024-03-27 IMAGING — US US OB < 14 WEEKS - US OB TV
1 series · 14 of 28 positions shown · non-contrast
Comparison: 12/27/2021

CLINICAL DATA: First trimester pregnancy with bleeding for 1 day

EXAM:
OBSTETRIC <14 WK US AND TRANSVAGINAL OB US
TECHNIQUE: Both transabdominal and transvaginal ultrasound examinations were
performed for complete evaluation of the gestation as well as the
maternal uterus, adnexal regions, and pelvic cul-de-sac.
Transvaginal technique was performed to assess early pregnancy.

[Series 1: us ob less than 14 weeks with ob transvaginal · 14 of 101 slices shown]
[im 4/101]
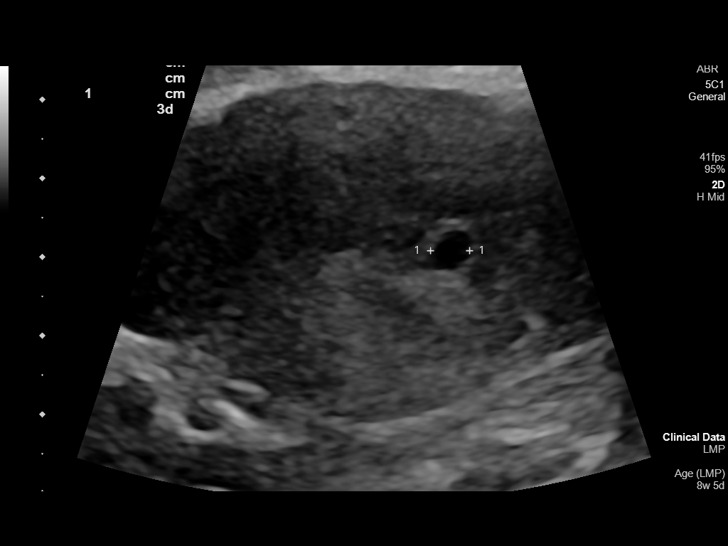
[im 12/101]
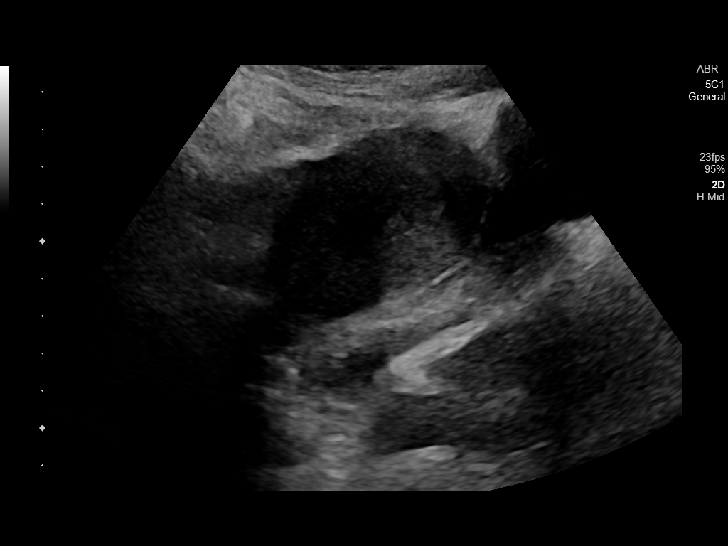
[im 19/101]
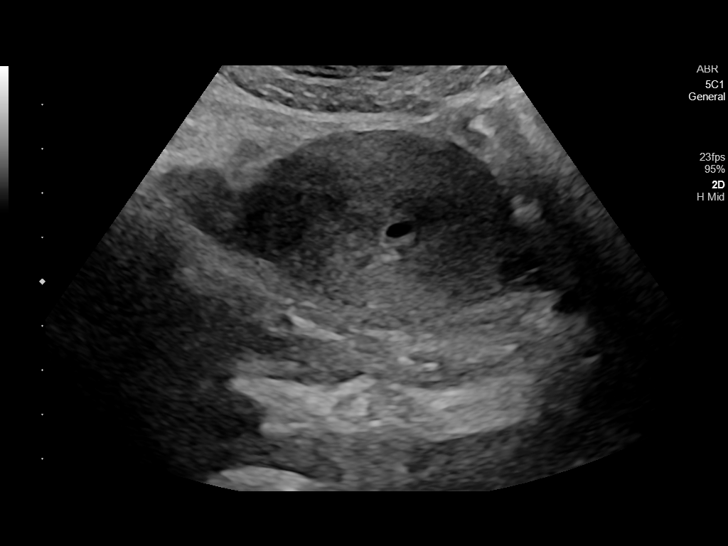
[im 26/101]
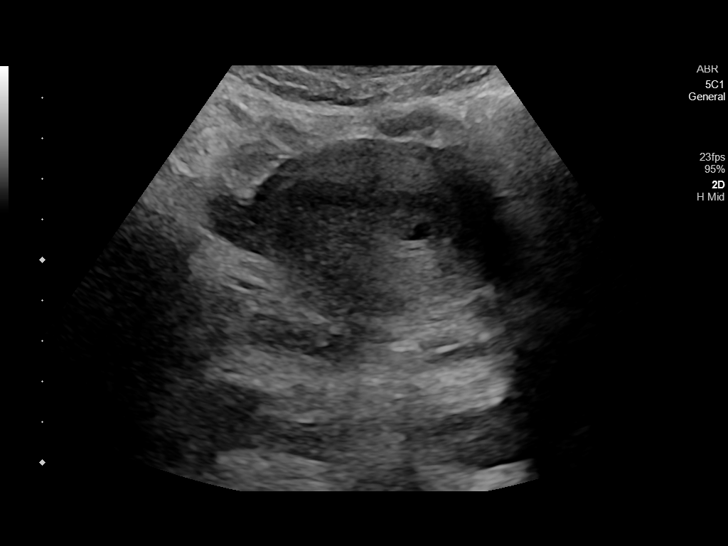
[im 34/101]
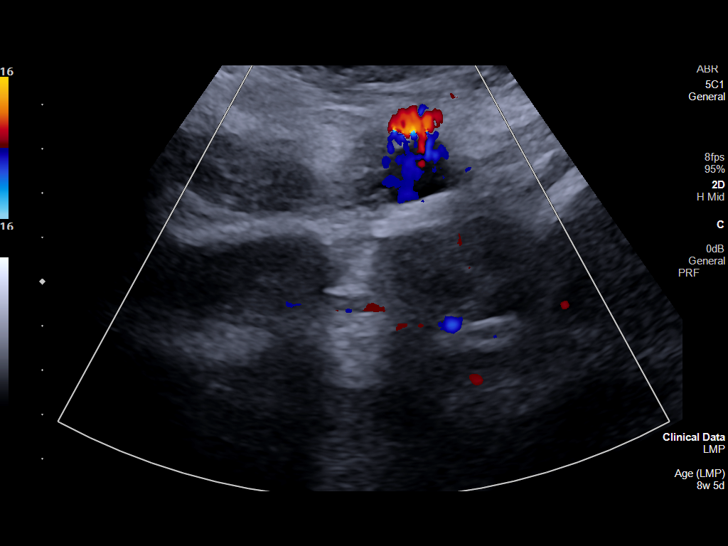
[im 41/101]
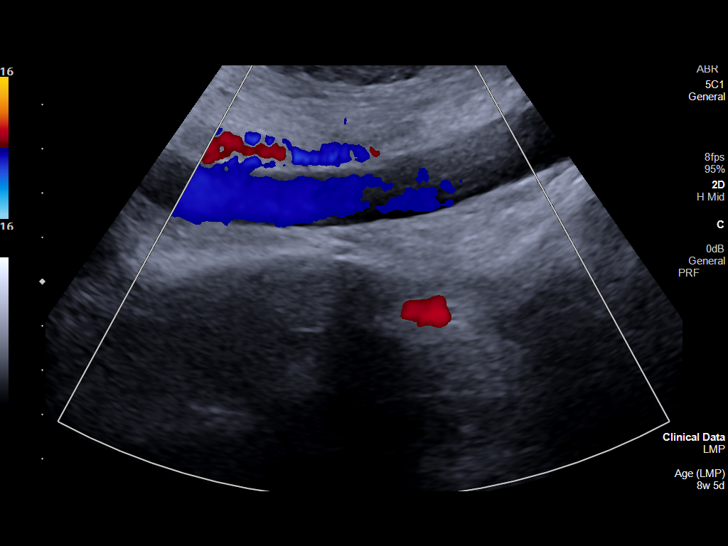
[im 49/101]
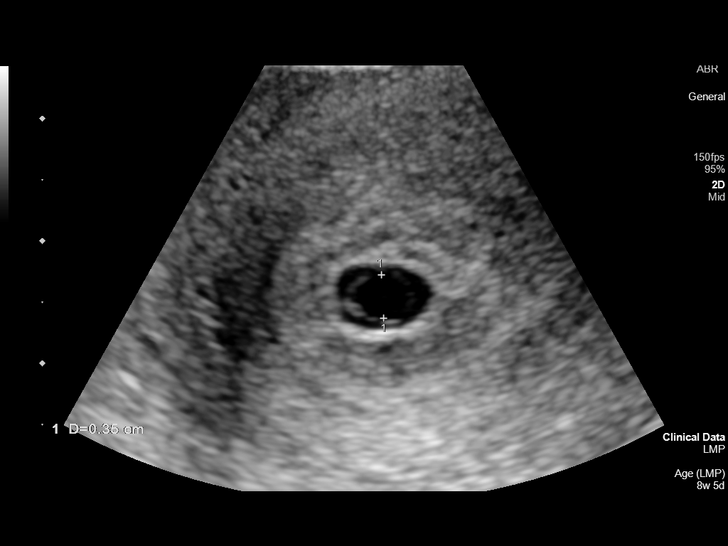
[im 56/101]
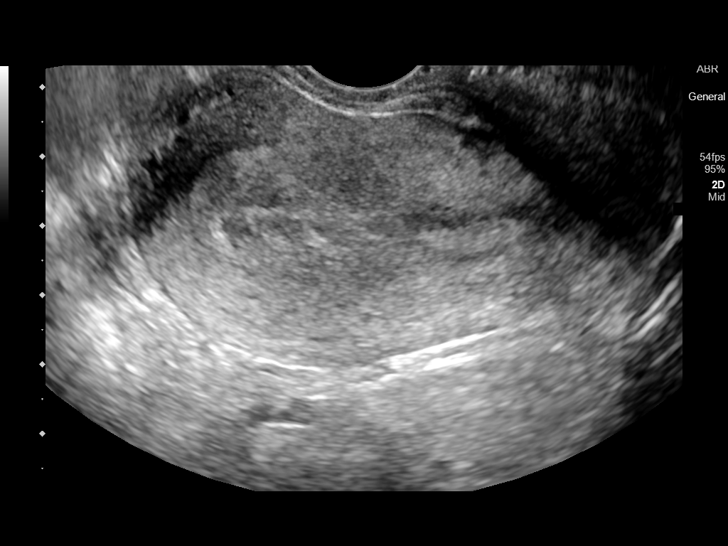
[im 63/101]
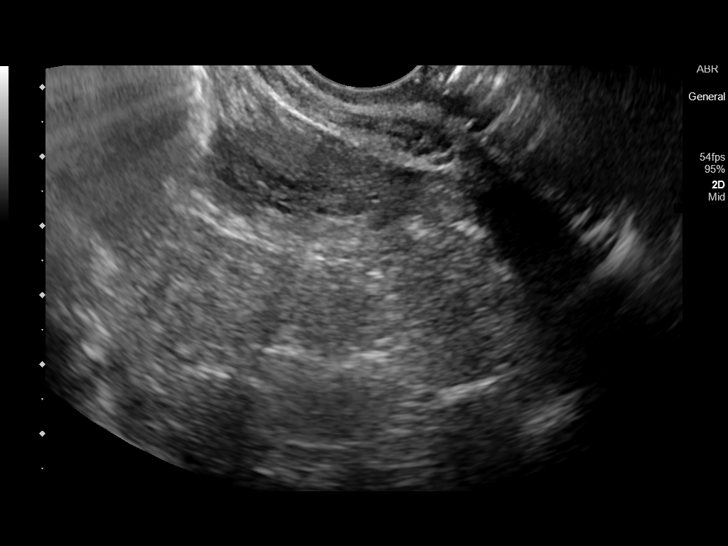
[im 71/101]
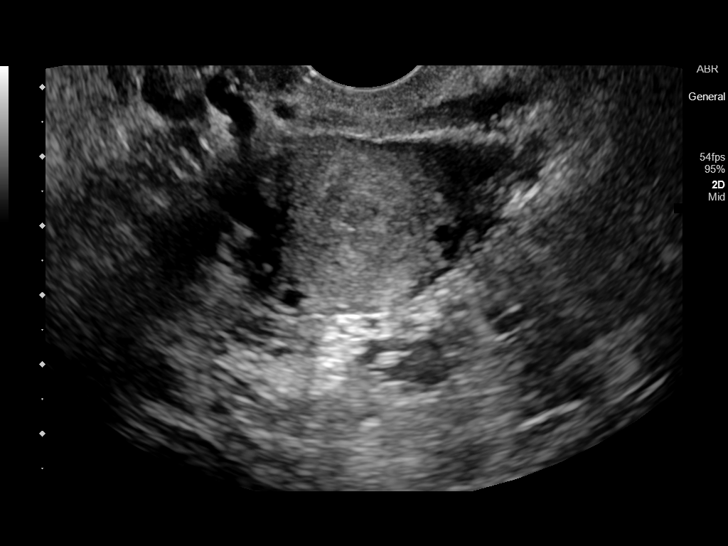
[im 78/101]
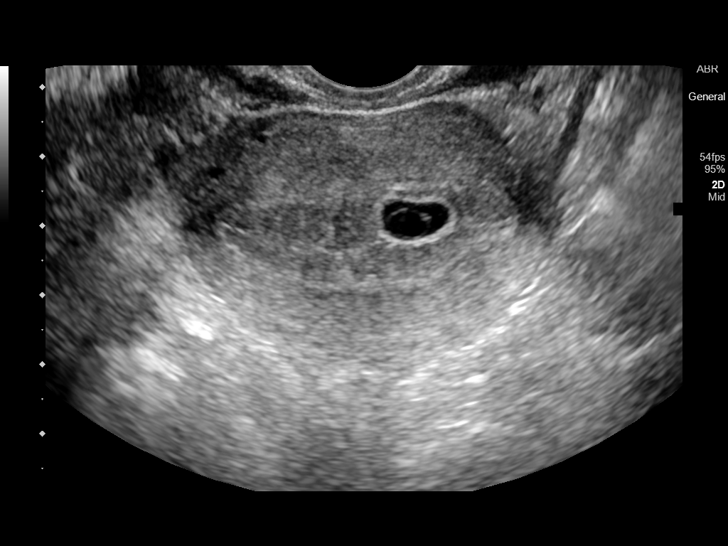
[im 86/101]
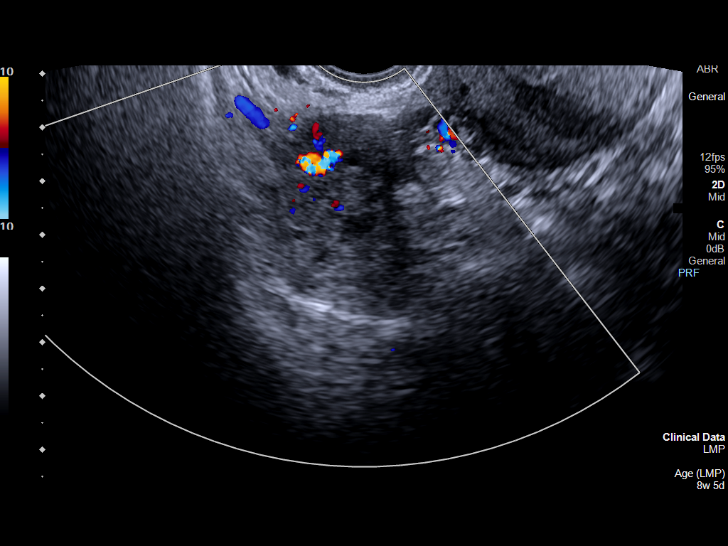
[im 93/101]
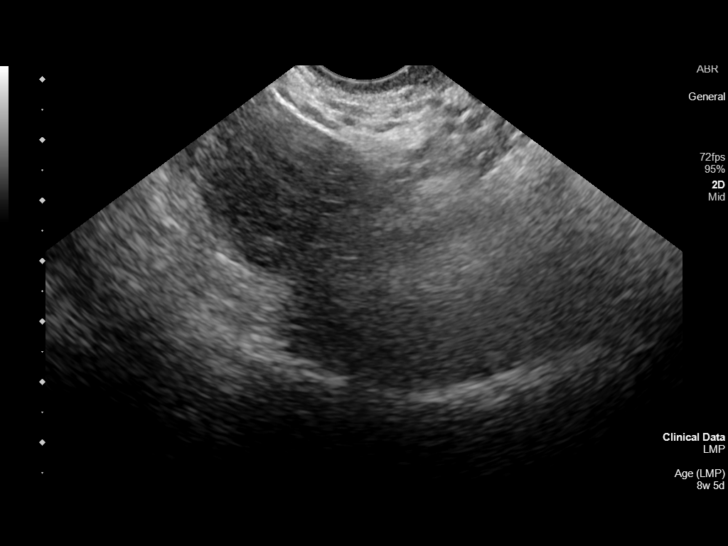
[im 101/101]
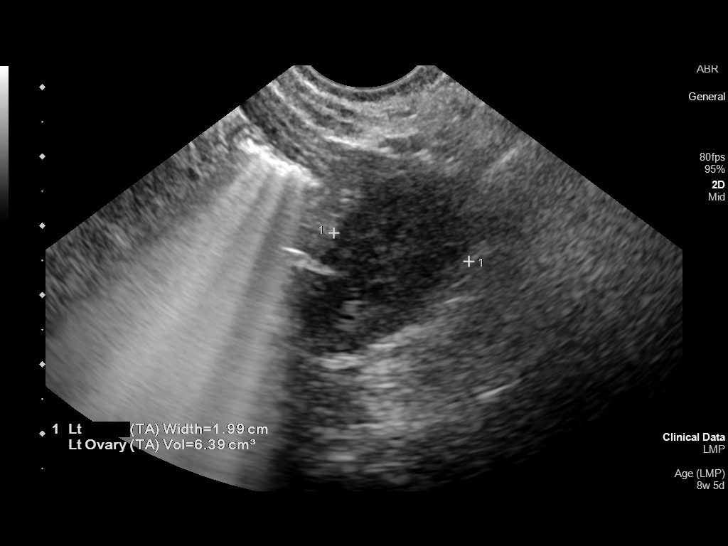

[14 of 28 positions shown; findings below may reference images not displayed]

FINDINGS: Intrauterine gestational sac: Single

Yolk sac:  Visualized and 5 mm.

Embryo:  Not visualized

MSD: 5 mm   5 w   2 d

Subchorionic hemorrhage:  None visualized.

Maternal uterus/adnexae: Negative for adnexal mass or pelvic fluid.
IMPRESSION: Intrauterine sac with yolk sac, mean sac diameter measures 5 weeks 2
days.
